# Patient Record
Sex: Male | Born: 1970
Health system: Southern US, Community
[De-identification: ages and names within clinical notes are randomized; demographics above are authoritative.]

## PROBLEM LIST (undated history)

## (undated) DIAGNOSIS — H905 Unspecified sensorineural hearing loss: Secondary | ICD-10-CM

## (undated) DIAGNOSIS — I2510 Atherosclerotic heart disease of native coronary artery without angina pectoris: Secondary | ICD-10-CM

## (undated) HISTORY — PX: SHOULDER SURGERY: SHX246

## (undated) HISTORY — DX: Atherosclerotic heart disease of native coronary artery without angina pectoris: I25.10

## (undated) HISTORY — DX: Unspecified sensorineural hearing loss: H90.5

## (undated) HISTORY — PX: ARTHROSCOPIC REPAIR ACL: SUR80

---

## 2016-01-14 ENCOUNTER — Ambulatory Visit (INDEPENDENT_AMBULATORY_CARE_PROVIDER_SITE_OTHER): Payer: BLUE CROSS/BLUE SHIELD

## 2016-01-14 ENCOUNTER — Ambulatory Visit (INDEPENDENT_AMBULATORY_CARE_PROVIDER_SITE_OTHER): Payer: BLUE CROSS/BLUE SHIELD | Admitting: Family Medicine

## 2016-01-14 VITALS — BP 110/70 | HR 68 | Temp 97.4°F | Resp 18 | Ht 68.5 in | Wt 189.0 lb

## 2016-01-14 DIAGNOSIS — M940 Chondrocostal junction syndrome [Tietze]: Secondary | ICD-10-CM | POA: Diagnosis not present

## 2016-01-14 DIAGNOSIS — Z1329 Encounter for screening for other suspected endocrine disorder: Secondary | ICD-10-CM | POA: Diagnosis not present

## 2016-01-14 DIAGNOSIS — R0789 Other chest pain: Secondary | ICD-10-CM

## 2016-01-14 DIAGNOSIS — Z13220 Encounter for screening for lipoid disorders: Secondary | ICD-10-CM | POA: Diagnosis not present

## 2016-01-14 DIAGNOSIS — Z125 Encounter for screening for malignant neoplasm of prostate: Secondary | ICD-10-CM

## 2016-01-14 DIAGNOSIS — R079 Chest pain, unspecified: Secondary | ICD-10-CM | POA: Diagnosis not present

## 2016-01-14 DIAGNOSIS — Z131 Encounter for screening for diabetes mellitus: Secondary | ICD-10-CM

## 2016-01-14 DIAGNOSIS — Z114 Encounter for screening for human immunodeficiency virus [HIV]: Secondary | ICD-10-CM

## 2016-01-14 DIAGNOSIS — Z0000 Encounter for general adult medical examination without abnormal findings: Secondary | ICD-10-CM | POA: Diagnosis not present

## 2016-01-14 LAB — LIPID PANEL
CHOLESTEROL: 174 mg/dL (ref 125–200)
HDL: 48 mg/dL (ref >=40)
LDL CALC: 114 mg/dL (ref <130)
Total CHOL/HDL Ratio: 3.6 Ratio (ref <=5.0)
Triglycerides: 62 mg/dL (ref <150)
VLDL: 12 mg/dL (ref <30)

## 2016-01-14 LAB — POCT URINALYSIS DIP (MANUAL ENTRY)
BILIRUBIN UA: NEGATIVE
Glucose, UA: NEGATIVE
Leukocytes, UA: NEGATIVE
NITRITE UA: NEGATIVE
PH UA: 5.5
Protein Ur, POC: NEGATIVE
SPEC GRAV UA: 1.025
UROBILINOGEN UA: 0.2

## 2016-01-14 LAB — CBC WITH DIFFERENTIAL/PLATELET
BASOS PCT: 0 %
Basophils Absolute: 0 cells/uL (ref 0–200)
EOS ABS: 75 {cells}/uL (ref 15–500)
EOS PCT: 1 %
HCT: 43.2 % (ref 38.5–50.0)
Hemoglobin: 14.5 g/dL (ref 13.2–17.1)
LYMPHS PCT: 21 %
Lymphs Abs: 1575 cells/uL (ref 850–3900)
MCH: 30 pg (ref 27.0–33.0)
MCHC: 33.6 g/dL (ref 32.0–36.0)
MCV: 89.3 fL (ref 80.0–100.0)
MONOS PCT: 9 %
MPV: 10.7 fL (ref 7.5–12.5)
Monocytes Absolute: 675 cells/uL (ref 200–950)
NEUTROS ABS: 5175 {cells}/uL (ref 1500–7800)
Neutrophils Relative %: 69 %
PLATELETS: 229 10*3/uL (ref 140–400)
RBC: 4.84 MIL/uL (ref 4.20–5.80)
RDW: 13.2 % (ref 11.0–15.0)
WBC: 7.5 10*3/uL (ref 3.8–10.8)

## 2016-01-14 LAB — POC MICROSCOPIC URINALYSIS (UMFC)

## 2016-01-14 LAB — COMPREHENSIVE METABOLIC PANEL
ALK PHOS: 53 U/L (ref 40–115)
ALT: 20 U/L (ref 9–46)
AST: 27 U/L (ref 10–40)
Albumin: 4.7 g/dL (ref 3.6–5.1)
BUN: 16 mg/dL (ref 7–25)
CO2: 27 mmol/L (ref 20–31)
CREATININE: 1.04 mg/dL (ref 0.60–1.35)
Calcium: 9.4 mg/dL (ref 8.6–10.3)
Chloride: 102 mmol/L (ref 98–110)
GLUCOSE: 84 mg/dL (ref 65–99)
Potassium: 4.1 mmol/L (ref 3.5–5.3)
Sodium: 140 mmol/L (ref 135–146)
TOTAL PROTEIN: 7.6 g/dL (ref 6.1–8.1)
Total Bilirubin: 0.9 mg/dL (ref 0.2–1.2)

## 2016-01-14 LAB — HIV ANTIBODY (ROUTINE TESTING W REFLEX): HIV 1&2 Ab, 4th Generation: NONREACTIVE

## 2016-01-14 LAB — PSA: PSA: 0.7 ng/mL (ref <=4.0)

## 2016-01-14 LAB — TSH: TSH: 2.68 m[IU]/L (ref 0.40–4.50)

## 2016-01-14 NOTE — Progress Notes (Signed)
Subjective:  By signing my name below, I, Corey [name], attest that this documentation has been prepared under the direction and in the presence of Corey SimmerKristi Trager, MD.  Electronically Signed: Andrew Auaven [name], ED Scribe. 01/14/2016. 10:08 PM.   Patient ID: Corey [name] Noe, male    DOB: 08/20/1970, 45 y.o.   MRN: [id number]  01/14/2016  Annual Exam (CPE (New patient))   HPI Corey [name] [name] is a 45 y.o. male who presents to the [facility] for an annual exam. This is a new patient. [place] moved [place] 8 months. Last physical was 1.5 years ago.   Hearing loss Pt reports hearing loss since birth, has hearing aids but doesn't wear them all the time.   Immunization Tetanus 5-6 years ago in [place].  Flu shot- pt never gets flu shots.   Vision  Visual Acuity Screening   Right eye Left eye Both eyes  Without correction:     With correction: 20/20 20/25 20/15    Pt is seen every year.  Dentist   He is seen every 6 months   PMHx  He reports hx of 2 herniated disc, one in cspine and lspine.  He has hx of a total of 3 arthroscopes. He also had a surgery in left shoulder to remove bone spurs.   Family hx His mother is 47 y.o. She has hx of lung cancer at age 47 and well as HTN, HLD. She was a smoker.  His father deceased at 5549. Hx of alcohol abuse. He has 2 brothers. His oldest brother has hx of arthritis.    Social hx  Pt has been married for 19 years. He has 2 step children. He lives with his 47 y.o son until he can find a place of their own. 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 He reports smoking and drinking as a teenager. He reports drug use from teenager to early 20's including marijuana, cocaine, crack, pcp and snorting heroine. Pt wears seatbelt. He does no text while driving.   Sleep Pt goes to bed at 12:30-1am and wakes up at 6-7am. This sleep pattern has been normal for him for a long time. He denies hard time falling asleep or staying asleep.    Exercise.  Pt rides bike every day for 30 mins   Chest pain Pt complains of occasional left chest pain. He suspects this is muscle pain. He has pain with deep breaths and touch. He reports similar pain about 1 year ago that last for about 7 days. He denies pain with exercise. He suspects He denies cough, SOB.   Vertigo He reports vertigo every 2 months. He has been evaluated in past. He suspects it is due to lack of sleep.   Rash Pt also had red rash on right shin. He has been told rash was eczema in the past. He applies Cerave cream.   Bowel movements Pt has a BM every day twice a day. He denies constipation, diarrhea, blood in stool, dark tarry stools.  Urine  He urinates once at night. He has a strong urinary cream.   Pt denies trouble with erection  Review of Systems  Constitutional: Negative for activity change, appetite change, chills, diaphoresis, fatigue, fever and unexpected weight change.  HENT: Positive for hearing loss. Negative for congestion, dental problem, drooling, ear discharge, ear pain, facial swelling, mouth sores, nosebleeds, postnasal drip, rhinorrhea, sinus pressure, sneezing, sore throat, tinnitus, trouble swallowing and voice change.  Eyes: Negative for photophobia, pain, discharge, redness, itching and visual disturbance.  Respiratory: Negative for apnea, cough, choking, chest tightness, shortness of breath, wheezing and stridor.   Cardiovascular: Positive for chest pain. Negative for palpitations and leg swelling.  Gastrointestinal: Negative for abdominal pain, blood in stool, constipation, diarrhea, nausea and vomiting.  Endocrine: Negative for cold intolerance, heat intolerance, polydipsia, polyphagia and polyuria.  Genitourinary: Negative for decreased urine volume, difficulty urinating, discharge, dysuria, enuresis, flank pain, frequency, genital sores, hematuria, penile pain, penile swelling, scrotal swelling, testicular pain and urgency.    Musculoskeletal: Positive for myalgias ( chest wall). Negative for arthralgias, back pain, gait problem, joint swelling, neck pain and neck stiffness.  Skin: Positive for rash. Negative for color change, pallor and wound.  Allergic/Immunologic: Negative for environmental allergies, food allergies and immunocompromised state.  Neurological: Positive for dizziness. Negative for tremors, seizures, syncope, facial asymmetry, speech difficulty, weakness, light-headedness, numbness and headaches.  Hematological: Negative for adenopathy. Does not bruise/bleed easily.  Psychiatric/Behavioral: Negative for agitation, behavioral problems, confusion, decreased concentration, dysphoric mood, hallucinations, self-injury, sleep disturbance and suicidal ideas. The patient is not nervous/anxious and is not hyperactive.     Past Medical History:  Diagnosis Date  . Congenital hearing loss    B hearing aides   Past Surgical History:  Procedure Laterality Date  . ARTHROSCOPIC REPAIR ACL     No Known Allergies No current outpatient prescriptions on file.   No current facility-administered medications for this visit.    Social History   Social History  . Marital status: Married    Spouse name: N/A  . Number of children: N/A  . Years of education: N/A   Occupational History  . Not on file.   Social History Main Topics  . Smoking status: Former Games developer  . Smokeless tobacco: Never Used  . Alcohol use No  . Drug use: Unknown  . Sexual activity: Yes    Partners: Female   Other Topics Concern  . Not on file   Social History Narrative   Marital status: married since 1998; moved from [place] in 2017      Children: 2 stepchildren (30, 23); 1 grandchild      Lives: with wife, son.      Employment:  Clinical biochemist      Tobacco:  Never      Alcohol: none      Drugs: none; marijuana/crack/cocaine/PCP/heroine; previous iv drug use as teenager; age [age].      Exercise:  Riding bike five days per  week; 30 minute bike ride at lunch each day      Seatbelt: 100%; no texting         Family History  Problem Relation Age of Onset  . Cancer Mother 2    lung cancer  . Hyperlipidemia Mother   . Hypertension Mother   . Transient ischemic attack Mother   . Alcohol abuse Father   . Arthritis Brother        Objective:    BP 110/70   Pulse 68   Temp 97.4 F (36.3 C) (Oral)   Resp 18   Ht 5' 8.5" (1.74 m)   Wt 189 lb (85.7 kg)   SpO2 97%   BMI 28.32 kg/m  Physical Exam  Constitutional: He is oriented to person, place, and time. He appears well-developed and well-nourished. No distress.  HENT:  Head: Normocephalic and atraumatic.  Right Ear: External ear normal.  Left Ear: External ear normal.  Nose: Nose normal.  Mouth/Throat: Oropharynx is clear and moist.  Eyes: Conjunctivae and EOM are normal. Pupils are equal, round, and reactive to light.  Neck: Normal range of motion. Neck supple. Carotid bruit is not present. No thyromegaly present.  Cardiovascular: Normal rate, regular rhythm, normal heart sounds and intact distal pulses.  Exam reveals no gallop and no friction rub.   No murmur heard. Pulmonary/Chest: Effort normal and breath sounds normal. He has no wheezes. He has no rales. He exhibits tenderness ( left anterior chest wall). He exhibits no laceration.  TTP left anterior chest wall  Abdominal: Soft. Bowel sounds are normal. He exhibits no distension and no mass. There is no tenderness. There is no rebound and no guarding. Hernia confirmed negative in the right inguinal area and confirmed negative in the left inguinal area.  Genitourinary: Prostate normal, testes normal and penis normal. Circumcised.  Musculoskeletal:       Right shoulder: Normal.       Left shoulder: Normal.       Cervical back: Normal.  Lymphadenopathy:    He has no cervical adenopathy.  Neurological: He is alert and oriented to person, place, and time. He has normal reflexes. No cranial nerve  deficit. He exhibits normal muscle tone. Coordination normal.  Skin: Skin is warm and dry. Rash noted. He is not diaphoretic.     Psychiatric: He has a normal mood and affect. His behavior is normal. Judgment and thought content normal.    Assessment & Plan:   1. Routine physical examination   2. Screening for prostate cancer   3. Screening for diabetes mellitus   4. Screening, lipid   5. Screening for HIV (human immunodeficiency virus)   6. Other chest pain   7. Screening for thyroid disorder   8. Costochondritis     Orders Placed This Encounter  Procedures  . DG Chest 2 View    Standing Status:   Future    Number of Occurrences:   1    Standing Expiration Date:   01/13/2017    Order Specific Question:   Reason for Exam (SYMPTOM  OR DIAGNOSIS REQUIRED)    Answer:   L sided chest pain; pain with deep inspiration    Order Specific Question:   Preferred imaging location?    Answer:   External  . CBC with Differential/Platelet  . Comprehensive metabolic panel    Order Specific Question:   Has the patient fasted?    Answer:   Yes  . Lipid panel    Order Specific Question:   Has the patient fasted?    Answer:   Yes  . HIV antibody  . PSA  . TSH  . Hemoglobin A1c  . POCT urinalysis dipstick  . POCT Microscopic Urinalysis ([facility])  . EKG 12-Lead     Return in about 1 year (around 01/13/2017) for complete physical examiniation.   I personally performed the services described in this documentation, which was scribed in my presence. The recorded information has been reviewed and considered.  Edward Trevino Paulita Fujita, M.D. [facility] [address] [place], [place]  [address] [phone] phone [phone] fax

## 2016-01-14 NOTE — Patient Instructions (Addendum)
1.  Return in 1-2 weeks to provide a repeat urine sample.   IF you received an x-ray today, you will receive an invoice from [facility]. Please contact [facility] at [phone] with questions or concerns regarding your invoice.   IF you received labwork today, you will receive an invoice from [facility]. Please contact [facility] at [phone] with questions or concerns regarding your invoice.   Our billing staff will not be able to assist you with questions regarding bills from these companies.  You will be contacted with the lab results as soon as they are available. The fastest way to get your results is to activate your My Chart account. Instructions are located on the last page of this paperwork. If you have not heard from us regarding the results in 2 weeks, please contact this office.     Keeping you healthy  Get these tests  Blood pressure- Have your blood pressure checked once a year by your healthcare provider.  Normal blood pressure is 120/80.  Weight- Have your body mass index (BMI) calculated to screen for obesity.  BMI is a measure of body fat based on height and weight. You can also calculate your own BMI at https://www.west-esparza.com/www.nhlbisupport.com/bmi/.  Cholesterol- Have your cholesterol checked regularly starting at age 47, sooner may be necessary if you have diabetes, high blood pressure, if a family member developed heart diseases at an early age or if you smoke.   Chlamydia, HIV, and other sexual transmitted disease- Get screened each year until the age of 47 then within three months of each new sexual partner.  Diabetes- Have your blood sugar checked regularly if you have high blood pressure, high cholesterol, a family history of diabetes or if you are overweight.  Get these vaccines  Flu shot- Every fall.  Tetanus shot- Every 10 years.  Menactra- Single dose; prevents meningitis.  Take these steps  Don't smoke- If you do  smoke, ask your healthcare provider about quitting. For tips on how to quit, go to www.smokefree.gov or call 1-800-QUIT-NOW.  Be physically active- Exercise 5 days a week for at least 30 minutes.  If you are not already physically active start slow and gradually work up to 30 minutes of moderate physical activity.  Examples of moderate activity include walking briskly, mowing the yard, dancing, swimming bicycling, etc.  Eat a healthy diet- Eat a variety of healthy foods such as fruits, vegetables, low fat milk, low fat cheese, yogurt, lean meats, poultry, fish, beans, tofu, etc.  For more information on healthy eating, go to www.thenutritionsource.org  Drink alcohol in moderation- Limit alcohol intake two drinks or less a day.  Never drink and drive.  Dentist- Brush and floss teeth twice daily; visit your dentis twice a year.  Depression-Your emotional health is as important as your physical health.  If you're feeling down, losing interest in things you normally enjoy please talk with your healthcare provider.  Gun Safety- If you keep a gun in your home, keep it unloaded and with the safety lock on.  Bullets should be stored separately.  Helmet use- Always wear a helmet when riding a motorcycle, bicycle, rollerblading or skateboarding.  Safe sex- If you may be exposed to a sexually transmitted infection, use a condom  Seat belts- Seat bels can save your life; always wear one.  Smoke/Carbon Monoxide detectors- These detectors need to be installed on the appropriate level of your home.  Replace batteries at least once a year.  Skin  Cancer- When out in the sun, cover up and use sunscreen SPF 15 or higher.  Violence- If anyone is threatening or hurting you, please tell your healthcare provider.

## 2016-01-15 LAB — HEMOGLOBIN A1C
HEMOGLOBIN A1C: 5.4 % (ref <5.7)
Mean Plasma Glucose: 108 mg/dL

## 2016-01-21 ENCOUNTER — Ambulatory Visit (INDEPENDENT_AMBULATORY_CARE_PROVIDER_SITE_OTHER): Payer: BLUE CROSS/BLUE SHIELD | Admitting: Family Medicine

## 2016-01-21 VITALS — BP 120/70 | HR 85 | Temp 97.7°F | Resp 18 | Ht 68.5 in | Wt 191.4 lb

## 2016-01-21 DIAGNOSIS — R319 Hematuria, unspecified: Secondary | ICD-10-CM

## 2016-01-21 LAB — POCT URINALYSIS DIP (MANUAL ENTRY)
Bilirubin, UA: NEGATIVE
GLUCOSE UA: NEGATIVE
Ketones, POC UA: NEGATIVE
Leukocytes, UA: NEGATIVE
NITRITE UA: NEGATIVE
PROTEIN UA: NEGATIVE
RBC UA: NEGATIVE
UROBILINOGEN UA: 0.2
pH, UA: 5.5

## 2016-01-21 LAB — POC MICROSCOPIC URINALYSIS (UMFC): Mucus: ABSENT

## 2016-01-21 NOTE — Progress Notes (Signed)
By signing my name below I, Shelah LewandowskyJoseph Thomas, attest that this documentation has been prepared under the direction and in the presence of Shade FloodJeffrey R Greene, MD. Electonically Signed. Shelah LewandowskyJoseph Thomas, Scribe 01/21/2016 at 3:48 PM  Subjective:    Patient ID: Corey [name], male    DOB: 07/21/1970, 45 y.o.   MRN: [id number]  Chief Complaint  Patient presents with   Follow-up    recheck urine (hematuria), patient declined flu vaccine    HPI Corey [name] is a 45 y.o. male who presents to the [facility] for reevaluation after hematuria was picked up during his annual physical on 01/14/16. Pt denies seeing any hematuria. Pt's routine labwork was otherwise normal at that time. Pt reports he had been riding his bike earlier during the day before urinalysis was done.   Pt reports that for the past 4-5 years he has been waking up once a night to urinate.   Pt saw a urologist 2 years ago for bladder discomfort and was given antibiotics which resolved pt's symptoms.   There are no active problems to display for this patient.  Past Medical History:  Diagnosis Date   Congenital hearing loss    B hearing aides   Past Surgical History:  Procedure Laterality Date   ARTHROSCOPIC REPAIR ACL     No Known Allergies Prior to Admission medications   Not on File   Social History   Social History   Marital status: Married    Spouse name: N/A   Number of children: N/A   Years of education: N/A   Occupational History   Not on file.   Social History Main Topics   Smoking status: Former Smoker   Smokeless tobacco: Never Used   Alcohol use No   Drug use: Unknown   Sexual activity: Yes    Partners: Female   Other Topics Concern   Not on file   Social History Narrative   Marital status: married since 1998; moved from [place] in 2017      Children: 2 stepchildren (30, 7633); 1 grandchild      Lives: with wife, son.      Employment:  Clinical biochemistComputer programming      Tobacco:   Never      Alcohol: none      Drugs: none; marijuana/crack/cocaine/PCP/heroine; previous iv drug use as teenager; age [age].      Exercise:  Riding bike five days per week; 30 minute bike ride at lunch each day      Seatbelt: 100%; no texting            Review of Systems  Constitutional: Negative for fever.  Genitourinary: Negative for frequency and hematuria.       Objective:   Physical Exam  Constitutional: He is oriented to person, place, and time. He appears well-developed and well-nourished. No distress.  HENT:  Head: Normocephalic and atraumatic.  Eyes: Conjunctivae are normal. Pupils are equal, round, and reactive to light.  Neck: Neck supple.  Cardiovascular: Normal rate.   Pulmonary/Chest: Effort normal.  Abdominal: Soft. Normal appearance and bowel sounds are normal. He exhibits no distension. There is no hepatosplenomegaly. There is no tenderness. There is no rigidity, no rebound, no guarding and no CVA tenderness. No hernia.  Musculoskeletal: Normal range of motion.  Neurological: He is alert and oriented to person, place, and time.  Skin: Skin is warm and dry.  Psychiatric: He has a normal mood and affect. His behavior is normal.  Nursing note and vitals reviewed.    Vitals:   01/21/16 1507  BP: 120/70  Pulse: 85  Resp: 18  Temp: 97.7 F (36.5 C)  TempSrc: Oral  SpO2: 96%  Weight: 191 lb 6 oz (86.8 kg)  Height: 5' 8.5" (1.74 m)   Results for orders placed or performed in visit on 01/21/16  POCT urinalysis dipstick  Result Value Ref Range   Color, UA yellow yellow   Clarity, UA clear clear   Glucose, UA negative negative   Bilirubin, UA negative negative   Ketones, POC UA negative negative   Spec Grav, UA <=1.005    Blood, UA negative negative   pH, UA 5.5    Protein Ur, POC negative negative   Urobilinogen, UA 0.2    Nitrite, UA Negative Negative   Leukocytes, UA Negative Negative  POCT Microscopic Urinalysis ([facility])  Result Value Ref Range    WBC,UR,HPF,POC None None WBC/hpf   RBC,UR,HPF,POC None None RBC/hpf   Bacteria None None, Too numerous to count   Mucus Absent Absent   Epithelial Cells, UR Per Microscopy None None, Too numerous to count cells/hpf         Assessment & Plan:  Corey [name] is a 45 y.o. male Hematuria - Plan: POCT urinalysis dipstick, POCT Microscopic Urinalysis ([facility])  Microscopic hematuria noted at physical, now clear. History of possible prostatitis based on his description when seen by urology in the past, most recent PSA reassuring along with lack of symptoms.   -Advised to return if any new urinary symptoms, or increased nocturia. Repeat urinalysis in 3-6 months. rtc precautions.  No orders of the defined types were placed in this encounter.  Patient Instructions     I do not see any blood in urine today. Recheck in 3-6 months for repeat urine test to make sure this remains clear. If you have any burning with urination, blood in urine, or any increase nighttime urinary symptoms, return for recheck.  IF you received an x-ray today, you will receive an invoice from [facility]. Please contact [facility] at [phone] with questions or concerns regarding your invoice.   IF you received labwork today, you will receive an invoice from [facility]. Please contact [facility] at [phone] with questions or concerns regarding your invoice.   Our billing staff will not be able to assist you with questions regarding bills from these companies.  You will be contacted with the lab results as soon as they are available. The fastest way to get your results is to activate your My Chart account. Instructions are located on the last page of this paperwork. If you have not heard from [place] regarding the results in 2 weeks, please contact this office.        I personally performed the services described in this documentation, which was scribed in my presence. The  recorded information has been reviewed and considered, and addended by me as needed.   Signed,   Meredith Staggers, MD [facility].  01/21/16 4:02 PM

## 2016-01-21 NOTE — Patient Instructions (Addendum)
   I do not see any blood in urine today. Recheck in 3-6 months for repeat urine test to make sure this remains clear. If you have any burning with urination, blood in urine, or any increase nighttime urinary symptoms, return for recheck.  IF you received an x-ray today, you will receive an invoice from [facility]. Please contact [facility] at [phone] with questions or concerns regarding your invoice.   IF you received labwork today, you will receive an invoice from [facility]. Please contact [facility] at [phone] with questions or concerns regarding your invoice.   Our billing staff will not be able to assist you with questions regarding bills from these companies.  You will be contacted with the lab results as soon as they are available. The fastest way to get your results is to activate your My Chart account. Instructions are located on the last page of this paperwork. If you have not heard from us regarding the results in 2 weeks, please contact this office.

## 2016-05-08 DIAGNOSIS — M545 Low back pain: Secondary | ICD-10-CM | POA: Diagnosis not present

## 2016-05-11 DIAGNOSIS — M545 Low back pain: Secondary | ICD-10-CM | POA: Diagnosis not present

## 2016-05-16 DIAGNOSIS — M545 Low back pain: Secondary | ICD-10-CM | POA: Diagnosis not present

## 2016-05-18 DIAGNOSIS — M545 Low back pain: Secondary | ICD-10-CM | POA: Diagnosis not present

## 2016-08-08 DIAGNOSIS — R234 Changes in skin texture: Secondary | ICD-10-CM | POA: Diagnosis not present

## 2016-08-08 DIAGNOSIS — L409 Psoriasis, unspecified: Secondary | ICD-10-CM | POA: Diagnosis not present

## 2016-10-24 DIAGNOSIS — L209 Atopic dermatitis, unspecified: Secondary | ICD-10-CM | POA: Diagnosis not present

## 2016-10-24 DIAGNOSIS — B356 Tinea cruris: Secondary | ICD-10-CM | POA: Diagnosis not present

## 2016-10-30 DIAGNOSIS — L409 Psoriasis, unspecified: Secondary | ICD-10-CM | POA: Diagnosis not present

## 2016-10-30 DIAGNOSIS — R21 Rash and other nonspecific skin eruption: Secondary | ICD-10-CM | POA: Diagnosis not present

## 2016-11-05 DIAGNOSIS — L304 Erythema intertrigo: Secondary | ICD-10-CM | POA: Diagnosis not present

## 2017-04-02 DIAGNOSIS — H6123 Impacted cerumen, bilateral: Secondary | ICD-10-CM | POA: Diagnosis not present

## 2017-06-16 DIAGNOSIS — H6120 Impacted cerumen, unspecified ear: Secondary | ICD-10-CM | POA: Diagnosis not present

## 2017-06-20 DIAGNOSIS — H6123 Impacted cerumen, bilateral: Secondary | ICD-10-CM | POA: Diagnosis not present

## 2017-06-21 ENCOUNTER — Encounter: Payer: Self-pay | Admitting: Family Medicine

## 2017-06-21 ENCOUNTER — Other Ambulatory Visit: Payer: Self-pay

## 2017-06-21 ENCOUNTER — Ambulatory Visit (INDEPENDENT_AMBULATORY_CARE_PROVIDER_SITE_OTHER): Payer: BLUE CROSS/BLUE SHIELD | Admitting: Family Medicine

## 2017-06-21 VITALS — BP 110/68 | HR 72 | Temp 98.0°F | Resp 18 | Ht 68.98 in | Wt 180.6 lb

## 2017-06-21 DIAGNOSIS — Z131 Encounter for screening for diabetes mellitus: Secondary | ICD-10-CM | POA: Diagnosis not present

## 2017-06-21 DIAGNOSIS — Z13220 Encounter for screening for lipoid disorders: Secondary | ICD-10-CM | POA: Diagnosis not present

## 2017-06-21 DIAGNOSIS — R42 Dizziness and giddiness: Secondary | ICD-10-CM

## 2017-06-21 DIAGNOSIS — R3121 Asymptomatic microscopic hematuria: Secondary | ICD-10-CM

## 2017-06-21 DIAGNOSIS — Z130 Encounter for screening for diseases of the blood and blood-forming organs and certain disorders involving the immune mechanism: Secondary | ICD-10-CM | POA: Diagnosis not present

## 2017-06-21 DIAGNOSIS — Z113 Encounter for screening for infections with a predominantly sexual mode of transmission: Secondary | ICD-10-CM | POA: Diagnosis not present

## 2017-06-21 DIAGNOSIS — Z0000 Encounter for general adult medical examination without abnormal findings: Secondary | ICD-10-CM | POA: Diagnosis not present

## 2017-06-21 DIAGNOSIS — L299 Pruritus, unspecified: Secondary | ICD-10-CM | POA: Diagnosis not present

## 2017-06-21 DIAGNOSIS — H8190 Unspecified disorder of vestibular function, unspecified ear: Secondary | ICD-10-CM

## 2017-06-21 DIAGNOSIS — G8929 Other chronic pain: Secondary | ICD-10-CM | POA: Diagnosis not present

## 2017-06-21 DIAGNOSIS — M545 Low back pain: Secondary | ICD-10-CM

## 2017-06-21 LAB — POCT URINALYSIS DIP (MANUAL ENTRY)
Bilirubin, UA: NEGATIVE
Blood, UA: NEGATIVE
GLUCOSE UA: NEGATIVE mg/dL
Ketones, POC UA: NEGATIVE mg/dL
LEUKOCYTES UA: NEGATIVE
NITRITE UA: NEGATIVE
PH UA: 6 (ref 5.0–8.0)
Protein Ur, POC: NEGATIVE mg/dL
Spec Grav, UA: >=1.03 — AB (ref 1.010–1.025)
Urobilinogen, UA: 0.2 E.U./dL

## 2017-06-21 LAB — POC MICROSCOPIC URINALYSIS (UMFC): Mucus: ABSENT

## 2017-06-21 NOTE — Patient Instructions (Addendum)
I will refer you to back specialist. See info below for now on back pain.   I will repeat urine test for blood.   Ok to use oil to itching of ear.  If that is not helping and dermatologist eval needed, please let me know.   Keeping you healthy  Get these tests  Blood pressure- Have your blood pressure checked once a year by your healthcare provider.  Normal blood pressure is 120/80.  Weight- Have your body mass index (BMI) calculated to screen for obesity.  BMI is a measure of body fat based on height and weight. You can also calculate your own BMI at [email or web address]-[email or web address].[email or web address]/.  Cholesterol- Have your cholesterol checked regularly starting at age 49, sooner may be necessary if you have diabetes, high blood pressure, if a family member developed heart diseases at an early age or if you smoke.   Chlamydia, HIV, and other sexual transmitted disease- Get screened each year until the age of 49 then within three months of each new sexual partner.  Diabetes- Have your blood sugar checked regularly if you have high blood pressure, high cholesterol, a family history of diabetes or if you are overweight.  Get these vaccines  Flu shot- Every fall.  Tetanus shot- Every 10 years.  Menactra- Single dose; prevents meningitis.   Back Pain, Adult Many adults have back pain from time to time. Common causes of back pain include:  A strained muscle or ligament.  Wear and tear (degeneration) of the spinal disks.  Arthritis.  A hit to the back.  Back pain can be short-lived (acute) or last a long time (chronic). A physical exam, lab tests, and imaging studies may be done to find the cause of your pain. Follow these instructions at home: Managing pain and stiffness  Take over-the-counter and prescription medicines only as told by your health care provider.  If directed, apply heat to the affected area as often as told by your health care provider. Use the heat source that your health care  provider recommends, such as a moist heat pack or a heating pad. ? Place a towel between your skin and the heat source. ? Leave the heat on for 20-30 minutes. ? Remove the heat if your skin turns bright red. This is especially important if you are unable to feel pain, heat, or cold. You have a greater risk of getting burned.  If directed, apply ice to the injured area: ? Put ice in a plastic bag. ? Place a towel between your skin and the bag. ? Leave the ice on for 20 minutes, 2-3 times a day for the first 2-3 days. Activity  Do not stay in bed. Resting more than 1-2 days can delay your recovery.  Take short walks on even surfaces as soon as you are able. Try to increase the length of time you walk each day.  Do not sit, drive, or stand in one place for more than 30 minutes at a time. Sitting or standing for long periods of time can put stress on your back.  Use proper lifting techniques. When you bend and lift, use positions that put less stress on your back: ? FordyceBend your knees. ? Keep the load close to your body. ? Avoid twisting.  Exercise regularly as told by your health care provider. Exercising will help your back heal faster. This also helps prevent back injuries by keeping muscles strong and flexible.  Your health care provider may recommend that you see a  physical therapist. This person can help you come up with a safe exercise program. Do any exercises as told by your physical therapist. Lifestyle  Maintain a healthy weight. Extra weight puts stress on your back and makes it difficult to have good posture.  Avoid activities or situations that make you feel anxious or stressed. Learn ways to manage anxiety and stress. One way to manage stress is through exercise. Stress and anxiety increase muscle tension and can make back pain worse. General instructions  Sleep on a firm mattress in a comfortable position. Try lying on your side with your knees slightly bent. If you lie on  your back, put a pillow under your knees.  Follow your treatment plan as told by your health care provider. This may include: ? Cognitive or behavioral therapy. ? Acupuncture or massage therapy. ? Meditation or yoga. Contact a health care provider if:  You have pain that is not relieved with rest or medicine.  You have increasing pain going down into your legs or buttocks.  Your pain does not improve in 2 weeks.  You have pain at night.  You lose weight.  You have a fever or chills. Get help right away if:  You develop new bowel or bladder control problems.  You have unusual weakness or numbness in your arms or legs.  You develop nausea or vomiting.  You develop abdominal pain.  You feel faint. Summary  Many adults have back pain from time to time. A physical exam, lab tests, and imaging studies may be done to find the cause of your pain.  Use proper lifting techniques. When you bend and lift, use positions that put less stress on your back.  Take over-the-counter and prescription medicines and apply heat or ice as directed by your health care provider. This information is not intended to replace advice given to you by your health care provider. Make sure you discuss any questions you have with your health care provider. Document Released: 05/07/2005 Document Revised: 06/11/2016 Document Reviewed: 06/11/2016 Elsevier Interactive Patient Education  2018 [employer].    Take these steps  Don't smoke- If you do smoke, ask your healthcare provider about quitting. For tips on how to quit, go to www.smokefree.gov or call 1-800-QUIT-NOW.  Be physically active- Exercise 5 days a week for at least 30 minutes.  If you are not already physically active start slow and gradually work up to 30 minutes of moderate physical activity.  Examples of moderate activity include walking briskly, mowing the yard, dancing, swimming bicycling, etc.  Eat a healthy diet- Eat a variety of  healthy foods such as fruits, vegetables, low fat milk, low fat cheese, yogurt, lean meats, poultry, fish, beans, tofu, etc.  For more information on healthy eating, go to www.thenutritionsource.org  Drink alcohol in moderation- Limit alcohol intake two drinks or less a day.  Never drink and drive.  Dentist- Brush and floss teeth twice daily; visit your dentis twice a year.  Depression-Your emotional health is as important as your physical health.  If you're feeling down, losing interest in things you normally enjoy please talk with your healthcare provider.  Gun Safety- If you keep a gun in your home, keep it unloaded and with the safety lock on.  Bullets should be stored separately.  Helmet use- Always wear a helmet when riding a motorcycle, bicycle, rollerblading or skateboarding.  Safe sex- If you may be exposed to a sexually transmitted infection, use a condom  Seat belts- Seat  bels can save your life; always wear one.  Smoke/Carbon Monoxide detectors- These detectors need to be installed on the appropriate level of your home.  Replace batteries at least once a year.  Skin Cancer- When out in the sun, cover up and use sunscreen SPF 15 or higher.  Violence- If anyone is threatening or hurting you, please tell your healthcare provider.  Vertigo Vertigo is the feeling that you or your surroundings are moving when they are not. Vertigo can be dangerous if it occurs while you are doing something that could endanger you or others, such as driving. What are the causes? This condition is caused by a disturbance in the signals that are sent by your body's sensory systems to your brain. Different causes of a disturbance can lead to vertigo, including:  Infections, especially in the inner ear.  A bad reaction to a drug, or misuse of alcohol and medicines.  Withdrawal from drugs or alcohol.  Quickly changing positions, as when lying down or rolling over in bed.  Migraine  headaches.  Decreased blood flow to the brain.  Decreased blood pressure.  Increased pressure in the brain from a head or neck injury, stroke, infection, tumor, or bleeding.  Central nervous system disorders.  What are the signs or symptoms? Symptoms of this condition usually occur when you move your head or your eyes in different directions. Symptoms may start suddenly, and they usually last for less than a minute. Symptoms may include:  Loss of balance and falling.  Feeling like you are spinning or moving.  Feeling like your surroundings are spinning or moving.  Nausea and vomiting.  Blurred vision or double vision.  Difficulty hearing.  Slurred speech.  Dizziness.  Involuntary eye movement (nystagmus).  Symptoms can be mild and cause only slight annoyance, or they can be severe and interfere with daily life. Episodes of vertigo may return (recur) over time, and they are often triggered by certain movements. Symptoms may improve over time. How is this diagnosed? This condition may be diagnosed based on medical history and the quality of your nystagmus. Your health care provider may test your eye movements by asking you to quickly change positions to trigger the nystagmus. This may be called the Dix-Hallpike test, head thrust test, or roll test. You may be referred to a health care provider who specializes in ear, nose, and throat (ENT) problems (otolaryngologist) or a provider who specializes in disorders of the central nervous system (neurologist). You may have additional testing, including:  A physical exam.  Blood tests.  MRI.  A CT scan.  An electrocardiogram (ECG). This records electrical activity in your heart.  An electroencephalogram (EEG). This records electrical activity in your brain.  Hearing tests.  How is this treated? Treatment for this condition depends on the cause and the severity of the symptoms. Treatment options include:  Medicines to treat  nausea or vertigo. These are usually used for severe cases. Some medicines that are used to treat other conditions may also reduce or eliminate vertigo symptoms. These include: ? Medicines that control allergies (antihistamines). ? Medicines that control seizures (anticonvulsants). ? Medicines that relieve depression (antidepressants). ? Medicines that relieve anxiety (sedatives).  Head movements to adjust your inner ear back to normal. If your vertigo is caused by an ear problem, your health care provider may recommend certain movements to correct the problem.  Surgery. This is rare.  Follow these instructions at home: Safety  Move slowly.Avoid sudden body or head movements.  Avoid driving.  Avoid operating heavy machinery.  Avoid doing any tasks that would cause danger to you or others if you would have a vertigo episode during the task.  If you have trouble walking or keeping your balance, try using a cane for stability. If you feel dizzy or unstable, sit down right away.  Return to your normal activities as told by your health care provider. Ask your health care provider what activities are safe for you. General instructions  Take over-the-counter and prescription medicines only as told by your health care provider.  Avoid certain positions or movements as told by your health care provider.  Drink enough fluid to keep your urine clear or pale yellow.  Keep all follow-up visits as told by your health care provider. This is important. Contact a health care provider if:  Your medicines do not relieve your vertigo or they make it worse.  You have a fever.  Your condition gets worse or you develop new symptoms.  Your family or friends notice any behavioral changes.  Your nausea or vomiting gets worse.  You have numbness or a "pins and needles" sensation in part of your body. Get help right away if:  You have difficulty moving or speaking.  You are always dizzy.  You  faint.  You develop severe headaches.  You have weakness in your hands, arms, or legs.  You have changes in your hearing or vision.  You develop a stiff neck.  You develop sensitivity to light. This information is not intended to replace advice given to you by your health care provider. Make sure you discuss any questions you have with your health care provider. Document Released: 02/14/2005 Document Revised: 10/19/2015 Document Reviewed: 08/30/2014 Elsevier Interactive Patient Education  2018 [employer].   IF you received an x-ray today, you will receive an invoice from [facility]. Please contact [facility] at [phone] with questions or concerns regarding your invoice.   IF you received labwork today, you will receive an invoice from [facility]. Please contact [facility] at [phone] with questions or concerns regarding your invoice.   Our billing staff will not be able to assist you with questions regarding bills from these companies.  You will be contacted with the lab results as soon as they are available. The fastest way to get your results is to activate your My Chart account. Instructions are located on the last page of this paperwork. If you have not heard from [place] regarding the results in 2 weeks, please contact this office.

## 2017-06-21 NOTE — Progress Notes (Signed)
Subjective:    Patient ID: Corey [name], male    DOB: 1970-10-05, 47 y.o.   MRN: [id number]  HPI Corey [name] is a 47 y.o. male Presents today for: Chief Complaint  Patient presents with  . Annual Exam   Here for complete physical/annual exam. Last seen September 2017 for hematuria. Asymptomatic at that time, microscopic hematuria initially a physical then cleared. Recommended repeat testing in 3-6 months.  Has not had retested, denies gross hematuria.   Back pain.  History of 2 herniated disk (4-5, 1-2) in lower back. MRI 10 years ago in [place]. No prior injection/surgery.  No recent eval with back specialist.  Pain with sitting especially without back support. Pain comes and goes. Treats with ice/exercise when needed. Has avoided lifting over 10 pounds, flairs with lifting grocery bags.exercises every morning from prior PT. Would like to meet with specialist to see if repeat imaging needed. No regular meds, occasional alleve for 3-7 days if flares.  No bowel or bladder incontinence, no saddle anesthesia, no lower extremity weakness.   Bread, tea about 4 hours ago.   Defers prostate cancer testing until next year. No FH of colon CA.   Immunization History  Administered Date(s) Administered  . Td 05/21/2010  refuses flu vaccine  STI screening: Active with wife only, no new contacts but would like to have testing done.   Depression screen: Depression screen [facility] 2/9 06/21/2017 01/14/2016  Decreased Interest 0 0  Down, Depressed, Hopeless 0 0  PHQ - 2 Score 0 0   Vision screen: Last saw optho about 1 and a half years ago.  No exam data present  Wears hearing aids bilaterally due to congenital hearing loss, those are doing well. Did have some irritation to right ear - saw ENT yesterday. ?psoriasis vs dry skin. Treated with hydrocortisone cream and olive oil.  Exercise Every day.   Episodic vertigo. Every few months. No recent worsening.   There are no active problems to display  for this patient.  Past Medical History:  Diagnosis Date  . Congenital hearing loss    B hearing aides   Past Surgical History:  Procedure Laterality Date  . ARTHROSCOPIC REPAIR ACL     No Known Allergies Prior to Admission medications   Not on File   Social History   Socioeconomic History  . Marital status: Married    Spouse name: Harriett Sine  . Number of children: 0  . Years of education: Not on file  . Highest education level: Not on file  Social Needs  . Financial resource strain: Not on file  . Food insecurity - worry: Not on file  . Food insecurity - inability: Not on file  . Transportation needs - medical: Not on file  . Transportation needs - non-medical: Not on file  Occupational History  . Not on file  Tobacco Use  . Smoking status: Former Games developer  . Smokeless tobacco: Never Used  Substance and Sexual Activity  . Alcohol use: No  . Drug use: No  . Sexual activity: Yes    Partners: Female  Other Topics Concern  . Not on file  Social History Narrative   Marital status: married since 1998; moved from [place] in 2017      Children: 2 stepchildren (30, 58); 1 grandchild      Lives: with wife, son.      Employment:  Clinical biochemist      Tobacco:  Never      Alcohol: none  Drugs: none; marijuana/crack/cocaine/PCP/heroine; previous iv drug use as teenager; age [age].      Exercise:  Riding bike five days per week; 30 minute bike ride at lunch each day      Seatbelt: 100%; no texting          Review of Systems 13 point review of systems per patient health survey noted.  Negative other than above.     Objective:   Physical Exam  Constitutional: He is oriented to person, place, and time. He appears well-developed and well-nourished.  HENT:  Head: Normocephalic and atraumatic.  Right Ear: External ear normal.  Left Ear: External ear normal.  Mouth/Throat: Oropharynx is clear and moist.  Eyes: Conjunctivae and EOM are normal. Pupils are equal, round, and  reactive to light.  Neck: Normal range of motion. Neck supple. No thyromegaly present.  Cardiovascular: Normal rate, regular rhythm, normal heart sounds and intact distal pulses.  Pulmonary/Chest: Effort normal and breath sounds normal. No respiratory distress. He has no wheezes.  Abdominal: Soft. He exhibits no distension. There is no tenderness.  Musculoskeletal: Normal range of motion. He exhibits no edema or tenderness.       Lumbar back: He exhibits spasm. He exhibits normal range of motion, no tenderness (min ttp paraspinals lower thoracic, upper lumbar spine. ) and no bony tenderness.  Lymphadenopathy:    He has no cervical adenopathy.  Neurological: He is alert and oriented to person, place, and time. He has normal strength and normal reflexes. No sensory deficit.  Reflex Scores:      Patellar reflexes are 2+ on the right side and 2+ on the left side.      Achilles reflexes are 2+ on the right side and 2+ on the left side. No LE weakness.   Skin: Skin is warm and dry.  Psychiatric: He has a normal mood and affect. His behavior is normal.  Vitals reviewed.  Vitals:   06/21/17 1349  BP: 110/68  Pulse: 72  Resp: 18  Temp: 98 F (36.7 C)  TempSrc: Oral  SpO2: 98%  Weight: 180 lb 9.6 oz (81.9 kg)  Height: 5' 8.98" (1.752 m)       Assessment & Plan:   Corey [name] is a 47 y.o. male Annual physical exam  - -anticipatory guidance as below in AVS, screening labs above. Health maintenance items as above in HPI discussed/recommended as applicable.   Screening, anemia, deficiency, iron - Plan: CBC  Screening for diabetes mellitus - Plan: Comprehensive metabolic panel  Screening for hyperlipidemia - Plan: Lipid panel  Asymptomatic microscopic hematuria - Plan: POCT urinalysis dipstick, POCT Microscopic Urinalysis ([facility])  - Prior reading. Repeat urinalysis, asymptomatic  Chronic bilateral low back pain without sciatica - Plan: Ambulatory referral to Orthopedic  Surgery  -Ongoing low back pain, some recent worsening possibly. Based on location appears to be paraspinals, likely will benefit from formal physical therapy. Referred to back specialist to determine next step in decision on imaging.  Ear itching  -Continue recommendations from your nose and throat. If referral needed for psoriasis, can refer to dermatology.  Episodic recurrent vertigo - Plan: CBC  -Rare symptoms, likely peripheral vertigo based on history. Asymptomatic at present.. RTC precautions if more frequent,  Routine screening for STI (sexually transmitted infection) - Plan: GC/Chlamydia Probe Amp, RPR, HIV antibody  -Low risk, requested testing.  No orders of the defined types were placed in this encounter.  Patient Instructions   I will refer you to back specialist. See info  below for now on back pain.   I will repeat urine test for blood.   Ok to use oil to itching of ear.  If that is not helping and dermatologist eval needed, please let me know.   Keeping you healthy  Get these tests  Blood pressure- Have your blood pressure checked once a year by your healthcare provider.  Normal blood pressure is 120/80.  Weight- Have your body mass index (BMI) calculated to screen for obesity.  BMI is a measure of body fat based on height and weight. You can also calculate your own BMI at https://www.west-esparza.com/.  Cholesterol- Have your cholesterol checked regularly starting at age 27, sooner may be necessary if you have diabetes, high blood pressure, if a family member developed heart diseases at an early age or if you smoke.   Chlamydia, HIV, and other sexual transmitted disease- Get screened each year until the age of 69 then within three months of each new sexual partner.  Diabetes- Have your blood sugar checked regularly if you have high blood pressure, high cholesterol, a family history of diabetes or if you are overweight.  Get these vaccines  Flu shot- Every  fall.  Tetanus shot- Every 10 years.  Menactra- Single dose; prevents meningitis.   Back Pain, Adult Many adults have back pain from time to time. Common causes of back pain include:  A strained muscle or ligament.  Wear and tear (degeneration) of the spinal disks.  Arthritis.  A hit to the back.  Back pain can be short-lived (acute) or last a long time (chronic). A physical exam, lab tests, and imaging studies may be done to find the cause of your pain. Follow these instructions at home: Managing pain and stiffness  Take over-the-counter and prescription medicines only as told by your health care provider.  If directed, apply heat to the affected area as often as told by your health care provider. Use the heat source that your health care provider recommends, such as a moist heat pack or a heating pad. ? Place a towel between your skin and the heat source. ? Leave the heat on for 20-30 minutes. ? Remove the heat if your skin turns bright red. This is especially important if you are unable to feel pain, heat, or cold. You have a greater risk of getting burned.  If directed, apply ice to the injured area: ? Put ice in a plastic bag. ? Place a towel between your skin and the bag. ? Leave the ice on for 20 minutes, 2-3 times a day for the first 2-3 days. Activity  Do not stay in bed. Resting more than 1-2 days can delay your recovery.  Take short walks on even surfaces as soon as you are able. Try to increase the length of time you walk each day.  Do not sit, drive, or stand in one place for more than 30 minutes at a time. Sitting or standing for long periods of time can put stress on your back.  Use proper lifting techniques. When you bend and lift, use positions that put less stress on your back: ? [place] your knees. ? Keep the load close to your body. ? Avoid twisting.  Exercise regularly as told by your health care provider. Exercising will help your back heal faster.  This also helps prevent back injuries by keeping muscles strong and flexible.  Your health care provider may recommend that you see a physical therapist. This person can help you come  up with a safe exercise program. Do any exercises as told by your physical therapist. Lifestyle  Maintain a healthy weight. Extra weight puts stress on your back and makes it difficult to have good posture.  Avoid activities or situations that make you feel anxious or stressed. Learn ways to manage anxiety and stress. One way to manage stress is through exercise. Stress and anxiety increase muscle tension and can make back pain worse. General instructions  Sleep on a firm mattress in a comfortable position. Try lying on your side with your knees slightly bent. If you lie on your back, put a pillow under your knees.  Follow your treatment plan as told by your health care provider. This may include: ? Cognitive or behavioral therapy. ? Acupuncture or massage therapy. ? Meditation or yoga. Contact a health care provider if:  You have pain that is not relieved with rest or medicine.  You have increasing pain going down into your legs or buttocks.  Your pain does not improve in 2 weeks.  You have pain at night.  You lose weight.  You have a fever or chills. Get help right away if:  You develop new bowel or bladder control problems.  You have unusual weakness or numbness in your arms or legs.  You develop nausea or vomiting.  You develop abdominal pain.  You feel faint. Summary  Many adults have back pain from time to time. A physical exam, lab tests, and imaging studies may be done to find the cause of your pain.  Use proper lifting techniques. When you bend and lift, use positions that put less stress on your back.  Take over-the-counter and prescription medicines and apply heat or ice as directed by your health care provider. This information is not intended to replace advice given to you by  your health care provider. Make sure you discuss any questions you have with your health care provider. Document Released: 05/07/2005 Document Revised: 06/11/2016 Document Reviewed: 06/11/2016 Elsevier Interactive Patient Education  2018 [employer]    Take these steps  Don't smoke- If you do smoke, ask your healthcare provider about quitting. For tips on how to quit, go to www.smokefree.gov or call 1-800-QUIT-NOW.  Be physically active- Exercise 5 days a week for at least 30 minutes.  If you are not already physically active start slow and gradually work up to 30 minutes of moderate physical activity.  Examples of moderate activity include walking briskly, mowing the yard, dancing, swimming bicycling, etc.  Eat a healthy diet- Eat a variety of healthy foods such as fruits, vegetables, low fat milk, low fat cheese, yogurt, lean meats, poultry, fish, beans, tofu, etc.  For more information on healthy eating, go to www.thenutritionsource.org  Drink alcohol in moderation- Limit alcohol intake two drinks or less a day.  Never drink and drive.  Dentist- Brush and floss teeth twice daily; visit your dentis twice a year.  Depression-Your emotional health is as important as your physical health.  If you're feeling down, losing interest in things you normally enjoy please talk with your healthcare provider.  Gun Safety- If you keep a gun in your home, keep it unloaded and with the safety lock on.  Bullets should be stored separately.  Helmet use- Always wear a helmet when riding a motorcycle, bicycle, rollerblading or skateboarding.  Safe sex- If you may be exposed to a sexually transmitted infection, use a condom  Seat belts- Seat bels can save your life; always wear one.  Smoke/Carbon Monoxide detectors- These detectors need to be installed on the appropriate level of your home.  Replace batteries at least once a year.  Skin Cancer- When out in the sun, cover up and use sunscreen SPF 15 or  higher.  Violence- If anyone is threatening or hurting you, please tell your healthcare provider.  Vertigo Vertigo is the feeling that you or your surroundings are moving when they are not. Vertigo can be dangerous if it occurs while you are doing something that could endanger you or others, such as driving. What are the causes? This condition is caused by a disturbance in the signals that are sent by your body's sensory systems to your brain. Different causes of a disturbance can lead to vertigo, including:  Infections, especially in the inner ear.  A bad reaction to a drug, or misuse of alcohol and medicines.  Withdrawal from drugs or alcohol.  Quickly changing positions, as when lying down or rolling over in bed.  Migraine headaches.  Decreased blood flow to the brain.  Decreased blood pressure.  Increased pressure in the brain from a head or neck injury, stroke, infection, tumor, or bleeding.  Central nervous system disorders.  What are the signs or symptoms? Symptoms of this condition usually occur when you move your head or your eyes in different directions. Symptoms may start suddenly, and they usually last for less than a minute. Symptoms may include:  Loss of balance and falling.  Feeling like you are spinning or moving.  Feeling like your surroundings are spinning or moving.  Nausea and vomiting.  Blurred vision or double vision.  Difficulty hearing.  Slurred speech.  Dizziness.  Involuntary eye movement (nystagmus).  Symptoms can be mild and cause only slight annoyance, or they can be severe and interfere with daily life. Episodes of vertigo may return (recur) over time, and they are often triggered by certain movements. Symptoms may improve over time. How is this diagnosed? This condition may be diagnosed based on medical history and the quality of your nystagmus. Your health care provider may test your eye movements by asking you to quickly change  positions to trigger the nystagmus. This may be called the Dix-Hallpike test, head thrust test, or roll test. You may be referred to a health care provider who specializes in ear, nose, and throat (ENT) problems (otolaryngologist) or a provider who specializes in disorders of the central nervous system (neurologist). You may have additional testing, including:  A physical exam.  Blood tests.  MRI.  A CT scan.  An electrocardiogram (ECG). This records electrical activity in your heart.  An electroencephalogram (EEG). This records electrical activity in your brain.  Hearing tests.  How is this treated? Treatment for this condition depends on the cause and the severity of the symptoms. Treatment options include:  Medicines to treat nausea or vertigo. These are usually used for severe cases. Some medicines that are used to treat other conditions may also reduce or eliminate vertigo symptoms. These include: ? Medicines that control allergies (antihistamines). ? Medicines that control seizures (anticonvulsants). ? Medicines that relieve depression (antidepressants). ? Medicines that relieve anxiety (sedatives).  Head movements to adjust your inner ear back to normal. If your vertigo is caused by an ear problem, your health care provider may recommend certain movements to correct the problem.  Surgery. This is rare.  Follow these instructions at home: Safety  Move slowly.Avoid sudden body or head movements.  Avoid driving.  Avoid operating heavy machinery.  Avoid  doing any tasks that would cause danger to you or others if you would have a vertigo episode during the task.  If you have trouble walking or keeping your balance, try using a cane for stability. If you feel dizzy or unstable, sit down right away.  Return to your normal activities as told by your health care provider. Ask your health care provider what activities are safe for you. General instructions  Take  over-the-counter and prescription medicines only as told by your health care provider.  Avoid certain positions or movements as told by your health care provider.  Drink enough fluid to keep your urine clear or pale yellow.  Keep all follow-up visits as told by your health care provider. This is important. Contact a health care provider if:  Your medicines do not relieve your vertigo or they make it worse.  You have a fever.  Your condition gets worse or you develop new symptoms.  Your family or friends notice any behavioral changes.  Your nausea or vomiting gets worse.  You have numbness or a "pins and needles" sensation in part of your body. Get help right away if:  You have difficulty moving or speaking.  You are always dizzy.  You faint.  You develop severe headaches.  You have weakness in your hands, arms, or legs.  You have changes in your hearing or vision.  You develop a stiff neck.  You develop sensitivity to light. This information is not intended to replace advice given to you by your health care provider. Make sure you discuss any questions you have with your health care provider. Document Released: 02/14/2005 Document Revised: 10/19/2015 Document Reviewed: 08/30/2014 Elsevier Interactive Patient Education  2018 [employer].   IF you received an x-ray today, you will receive an invoice from [facility]. Please contact [facility] at [phone] with questions or concerns regarding your invoice.   IF you received labwork today, you will receive an invoice from [place]. Please contact [facility] at [phone] with questions or concerns regarding your invoice.   Our billing staff will not be able to assist you with questions regarding bills from these companies.  You will be contacted with the lab results as soon as they are available. The fastest way to get your results is to activate your My Chart account. Instructions are located on  the last page of this paperwork. If you have not heard from [place] regarding the results in 2 weeks, please contact this office.      Signed,   Meredith Staggers, MD [facility].  06/22/17 4:22 PM

## 2017-06-22 LAB — COMPREHENSIVE METABOLIC PANEL
ALBUMIN: 4.6 g/dL (ref 3.5–5.5)
ALT: 16 IU/L (ref 0–44)
AST: 25 IU/L (ref 0–40)
Albumin/Globulin Ratio: 1.8 (ref 1.2–2.2)
Alkaline Phosphatase: 50 IU/L (ref 39–117)
BUN / CREAT RATIO: 19 (ref 9–20)
BUN: 20 mg/dL (ref 6–24)
Bilirubin Total: 0.6 mg/dL (ref 0.0–1.2)
CALCIUM: 9.4 mg/dL (ref 8.7–10.2)
CO2: 27 mmol/L (ref 20–29)
Chloride: 100 mmol/L (ref 96–106)
Creatinine, Ser: 1.04 mg/dL (ref 0.76–1.27)
GFR calc non Af Amer: 85 mL/min/{1.73_m2} (ref >59)
GFR, EST AFRICAN AMERICAN: 98 mL/min/{1.73_m2} (ref >59)
GLUCOSE: 87 mg/dL (ref 65–99)
Globulin, Total: 2.5 g/dL (ref 1.5–4.5)
Potassium: 5 mmol/L (ref 3.5–5.2)
Sodium: 141 mmol/L (ref 134–144)
TOTAL PROTEIN: 7.1 g/dL (ref 6.0–8.5)

## 2017-06-22 LAB — CBC
Hematocrit: 43 % (ref 37.5–51.0)
Hemoglobin: 14.3 g/dL (ref 13.0–17.7)
MCH: 30 pg (ref 26.6–33.0)
MCHC: 33.3 g/dL (ref 31.5–35.7)
MCV: 90 fL (ref 79–97)
PLATELETS: 196 10*3/uL (ref 150–379)
RBC: 4.76 x10E6/uL (ref 4.14–5.80)
RDW: 13.3 % (ref 12.3–15.4)
WBC: 4.5 10*3/uL (ref 3.4–10.8)

## 2017-06-22 LAB — RPR: RPR Ser Ql: NONREACTIVE

## 2017-06-22 LAB — LIPID PANEL
Chol/HDL Ratio: 3.7 ratio (ref 0.0–5.0)
Cholesterol, Total: 157 mg/dL (ref 100–199)
HDL: 42 mg/dL (ref >39)
LDL Calculated: 101 mg/dL — ABNORMAL HIGH (ref 0–99)
Triglycerides: 70 mg/dL (ref 0–149)
VLDL CHOLESTEROL CAL: 14 mg/dL (ref 5–40)

## 2017-06-22 LAB — HIV ANTIBODY (ROUTINE TESTING W REFLEX): HIV Screen 4th Generation wRfx: NONREACTIVE

## 2017-06-23 LAB — GC/CHLAMYDIA PROBE AMP
Chlamydia trachomatis, NAA: NEGATIVE
Neisseria gonorrhoeae by PCR: NEGATIVE

## 2017-07-19 DIAGNOSIS — M545 Low back pain: Secondary | ICD-10-CM | POA: Diagnosis not present

## 2017-07-19 DIAGNOSIS — M546 Pain in thoracic spine: Secondary | ICD-10-CM | POA: Diagnosis not present

## 2017-08-13 DIAGNOSIS — M5033 Other cervical disc degeneration, cervicothoracic region: Secondary | ICD-10-CM | POA: Diagnosis not present

## 2017-08-13 DIAGNOSIS — M542 Cervicalgia: Secondary | ICD-10-CM | POA: Diagnosis not present

## 2017-08-14 DIAGNOSIS — H6981 Other specified disorders of Eustachian tube, right ear: Secondary | ICD-10-CM | POA: Diagnosis not present

## 2017-08-20 DIAGNOSIS — M542 Cervicalgia: Secondary | ICD-10-CM | POA: Diagnosis not present

## 2017-08-20 DIAGNOSIS — M5033 Other cervical disc degeneration, cervicothoracic region: Secondary | ICD-10-CM | POA: Diagnosis not present

## 2017-08-22 DIAGNOSIS — M5033 Other cervical disc degeneration, cervicothoracic region: Secondary | ICD-10-CM | POA: Diagnosis not present

## 2017-08-22 DIAGNOSIS — M542 Cervicalgia: Secondary | ICD-10-CM | POA: Diagnosis not present

## 2017-08-27 DIAGNOSIS — M5033 Other cervical disc degeneration, cervicothoracic region: Secondary | ICD-10-CM | POA: Diagnosis not present

## 2017-08-27 DIAGNOSIS — M542 Cervicalgia: Secondary | ICD-10-CM | POA: Diagnosis not present

## 2017-08-29 DIAGNOSIS — M5033 Other cervical disc degeneration, cervicothoracic region: Secondary | ICD-10-CM | POA: Diagnosis not present

## 2017-08-29 DIAGNOSIS — M542 Cervicalgia: Secondary | ICD-10-CM | POA: Diagnosis not present

## 2017-09-03 DIAGNOSIS — M5033 Other cervical disc degeneration, cervicothoracic region: Secondary | ICD-10-CM | POA: Diagnosis not present

## 2017-09-03 DIAGNOSIS — M542 Cervicalgia: Secondary | ICD-10-CM | POA: Diagnosis not present

## 2017-09-10 DIAGNOSIS — M5033 Other cervical disc degeneration, cervicothoracic region: Secondary | ICD-10-CM | POA: Diagnosis not present

## 2017-09-10 DIAGNOSIS — M542 Cervicalgia: Secondary | ICD-10-CM | POA: Diagnosis not present

## 2017-09-16 DIAGNOSIS — M542 Cervicalgia: Secondary | ICD-10-CM | POA: Diagnosis not present

## 2017-09-16 DIAGNOSIS — M5033 Other cervical disc degeneration, cervicothoracic region: Secondary | ICD-10-CM | POA: Diagnosis not present

## 2017-10-10 ENCOUNTER — Encounter: Payer: Self-pay | Admitting: Family Medicine

## 2018-10-10 DIAGNOSIS — L239 Allergic contact dermatitis, unspecified cause: Secondary | ICD-10-CM | POA: Diagnosis not present

## 2018-10-10 DIAGNOSIS — B009 Herpesviral infection, unspecified: Secondary | ICD-10-CM | POA: Diagnosis not present

## 2018-11-18 DIAGNOSIS — M7052 Other bursitis of knee, left knee: Secondary | ICD-10-CM | POA: Diagnosis not present

## 2018-11-18 DIAGNOSIS — M25562 Pain in left knee: Secondary | ICD-10-CM | POA: Diagnosis not present

## 2018-12-03 DIAGNOSIS — Z20828 Contact with and (suspected) exposure to other viral communicable diseases: Secondary | ICD-10-CM | POA: Diagnosis not present

## 2019-02-17 DIAGNOSIS — L409 Psoriasis, unspecified: Secondary | ICD-10-CM | POA: Diagnosis not present

## 2019-02-17 DIAGNOSIS — L906 Striae atrophicae: Secondary | ICD-10-CM | POA: Diagnosis not present

## 2019-02-17 DIAGNOSIS — Z23 Encounter for immunization: Secondary | ICD-10-CM | POA: Diagnosis not present

## 2019-03-12 ENCOUNTER — Other Ambulatory Visit: Payer: Self-pay

## 2019-03-12 ENCOUNTER — Ambulatory Visit (INDEPENDENT_AMBULATORY_CARE_PROVIDER_SITE_OTHER): Payer: BC Managed Care – PPO | Admitting: Family Medicine

## 2019-03-12 ENCOUNTER — Encounter: Payer: Self-pay | Admitting: Family Medicine

## 2019-03-12 VITALS — BP 130/88 | HR 90 | Wt 190.6 lb

## 2019-03-12 DIAGNOSIS — Z130 Encounter for screening for diseases of the blood and blood-forming organs and certain disorders involving the immune mechanism: Secondary | ICD-10-CM

## 2019-03-12 DIAGNOSIS — Z0001 Encounter for general adult medical examination with abnormal findings: Secondary | ICD-10-CM | POA: Diagnosis not present

## 2019-03-12 DIAGNOSIS — G44319 Acute post-traumatic headache, not intractable: Secondary | ICD-10-CM | POA: Diagnosis not present

## 2019-03-12 DIAGNOSIS — Z1329 Encounter for screening for other suspected endocrine disorder: Secondary | ICD-10-CM

## 2019-03-12 DIAGNOSIS — Z0000 Encounter for general adult medical examination without abnormal findings: Secondary | ICD-10-CM

## 2019-03-12 DIAGNOSIS — E559 Vitamin D deficiency, unspecified: Secondary | ICD-10-CM | POA: Diagnosis not present

## 2019-03-12 DIAGNOSIS — Z131 Encounter for screening for diabetes mellitus: Secondary | ICD-10-CM

## 2019-03-12 DIAGNOSIS — Z1211 Encounter for screening for malignant neoplasm of colon: Secondary | ICD-10-CM

## 2019-03-12 DIAGNOSIS — Z13220 Encounter for screening for lipoid disorders: Secondary | ICD-10-CM

## 2019-03-12 DIAGNOSIS — L408 Other psoriasis: Secondary | ICD-10-CM

## 2019-03-12 DIAGNOSIS — N5089 Other specified disorders of the male genital organs: Secondary | ICD-10-CM

## 2019-03-12 DIAGNOSIS — Z125 Encounter for screening for malignant neoplasm of prostate: Secondary | ICD-10-CM | POA: Diagnosis not present

## 2019-03-12 NOTE — Progress Notes (Signed)
Subjective:    Patient ID: Corey [name], male    DOB: [birth year], 48 y.o.   MRN: [id number]  HPI Corey [name] is a 48 y.o. male Presents today for: Chief Complaint  Patient presents with  . Annual Exam    annual exam   Presents for annual physical exam.  Last physical in February 2019.  History of congenital hearing loss, wears hearing aids, followed by ENT. Prior history of episodic vertigo - doing ok. No recent flair. Last 5-6 months ago.   Back pain  discussed last year, referred to Dr. Yevette Edwards.  Evaluated for cervical, thoracic, lumbar pain, thought to have myofascial component along with prior multiple disc herniations plan for MRI then likely physical therapy. MRI was denied. Did go to PT, helped.  Doing ok now.   Head contusion, headache: Hit R side of head painting on side of roof. No LOC, sore for a minute or two, no bleeding, no wound, no swelling.  No n/v/weakness. No visions changes.  Initial week felt ok, then increased pain a week later - felt like baseball hit on side of head. Trouble thinking, but no n/v/vision changes. Constant pain for 3 weeks, now improved. Last HA about 1-2 weeks ago. Tx: deep blue topical - improved pain. (placed on head and neck).   Hernia? In left scrotum for years, has not had treated recently. Prior L hernia repair at 48yo on left.  Sore at times - stinging pain a few times per year. No penile d/c, no urinary sx's. Feel like left testicle larger than right past 5 years. No prior ultrasound.   Inverse psoriasis: Treated by Urgent Care in past.  Scrotum, penis, perianal area.  Treats with Ceravie, custom essential oil compound.  Flares 3x/year.  Used hydrocortisone in past, caused stretch marks.  Treated with Protopic by dermatology few years ago - Elmon Else.  Vitamin D deficiency:  Taking 3000-4000 units per day.  Low reading in [place]. Unknown reading.   Cancer screening: Colonoscopy - agrees to referral.  Prostate  - agrees to  testing after R/B of testing.  Derm - as above  Immunization History  Administered Date(s) Administered  . Td 05/21/2010  Flu vaccine: declines flu vaccine, including after discussion and risk of "twindemic".  Shingles vaccine: declines until age 58.   Depression screen [facility] 2/9 03/12/2019 06/21/2017 01/14/2016  Decreased Interest 0 0 0  Down, Depressed, Hopeless 0 0 0  PHQ - 2 Score 0 0 0    Hearing Screening             Right ear:           Left ear:             Visual Acuity Screening   Right eye Left eye Both eyes  Without correction:     With correction:  last optho visit 3 yrs ago - plans on scheduling appt.   Dental: last appt 8months ago appt in December.   Exercise: per day. Playing tennis on weekends, and mowing yard.   There are no active problems to display for this patient.  Past Medical History:  Diagnosis Date  . Congenital hearing loss    B hearing aides   Past Surgical History:  Procedure Laterality Date  . ARTHROSCOPIC REPAIR ACL     No Known Allergies Prior to Admission medications   Not on File   Social History   Socioeconomic History  . Marital status: Married  Spouse name: Harriett Sineancy  . Number of children: 0  . Years of education: Not on file  . Highest education level: Not on file  Occupational History  . Not on file  Social Needs  . Financial resource strain: Not on file  . Food insecurity    Worry: Not on file    Inability: Not on file  . Transportation needs    Medical: Not on file    Non-medical: Not on file  Tobacco Use  . Smoking status: Former Games developermoker  . Smokeless tobacco: Never Used  Substance and Sexual Activity  . Alcohol use: No  . Drug use: No  . Sexual activity: Yes    Partners: Female  Lifestyle  . Physical activity    Days per week: Not on file    Minutes per session: Not on file  . Stress: Not on file  Relationships  . Social  Musicianconnections    Talks on phone: Not on file    Gets together: Not on file    Attends religious service: Not on file    Active member of club or organization: Not on file    Attends meetings of clubs or organizations: Not on file    Relationship status: Not on file  . Intimate partner violence    Fear of current or ex partner: Not on file    Emotionally abused: Not on file    Physically abused: Not on file    Forced sexual activity: Not on file  Other Topics Concern  . Not on file  Social History Narrative   Marital status: married since 1998; moved from [place] in 2017      Children: 2 stepchildren (30, [phone]); 1 grandchild      Lives: with wife, son.      Employment:  Clinical biochemistComputer programming      Tobacco:  Never      Alcohol: none      Drugs: none; marijuana/crack/cocaine/PCP/heroine; previous iv drug use as teenager; age [age].      Exercise:  Riding bike five days per week; 30 minute bike ride at lunch each day      Seatbelt: 100%; no texting          Review of Systems  Headache Rash.  13 point review of systems per patient health survey noted.  Negative other than as indicated above or in HPI.      Objective:   Physical Exam Vitals signs reviewed.  Constitutional:      Appearance: He is well-developed.  HENT:     Head: Normocephalic and atraumatic.     Right Ear: External ear normal.     Left Ear: External ear normal.  Eyes:     Conjunctiva/sclera: Conjunctivae normal.     Pupils: Pupils are equal, round, and reactive to light.  Neck:     Musculoskeletal: Normal range of motion and neck supple.     Thyroid: No thyromegaly.  Cardiovascular:     Rate and Rhythm: Normal rate and regular rhythm.     Heart sounds: Normal heart sounds.  Pulmonary:     Effort: Pulmonary effort is normal. No respiratory distress.     Breath sounds: Normal breath sounds. No wheezing.  Abdominal:     General: There is no distension.     Palpations: Abdomen is soft.     Tenderness: There is no  abdominal tenderness.     Hernia: There is no hernia in the left inguinal area or right inguinal area.  Genitourinary:    Prostate: Normal.    Musculoskeletal: Normal range of motion.        General: No tenderness.  Lymphadenopathy:     Cervical: No cervical adenopathy.  Skin:    General: Skin is warm and dry.  Neurological:     Mental Status: He is alert and oriented to person, place, and time.     Deep Tendon Reflexes: Reflexes are normal and symmetric.  Psychiatric:        Behavior: Behavior normal.    Vitals:   03/12/19 1036  BP: 130/88  Pulse: 90  SpO2: 97%  Weight: 190 lb 9.6 oz (86.5 kg)  Patient declined temperature measurement.  Did not want to remove mask. Denies fever, no current illness symptoms.     Assessment & Plan:   Edmund Holcomb is a 48 y.o. male Annual physical exam  - -anticipatory guidance as below in AVS, screening labs above. Health maintenance items as above in HPI discussed/recommended as applicable.   Acute post-traumatic headache, not intractable  - resolved. rtc precautions discussed.   Vitamin D deficiency - Plan: Vitamin D, 25-hydroxy  - continue supplement, check labs.   Screening for prostate cancer - Plan: PSA  - We discussed pros and cons of prostate cancer screening, and after this discussion, he chose to have screening done. PSA obtained, and no concerning findings on DRE.   Special screening for malignant neoplasms, colon - Plan: Ambulatory referral to Gastroenterology  Screening for diabetes mellitus - Plan: Comprehensive metabolic panel  Screening for thyroid disorder - Plan: TSH  Screening, anemia, deficiency, iron - Plan: CBC  Screening for hyperlipidemia - Plan: Lipid panel  Scrotal sac enlargement - Plan: US Scrotum  - varicocele vs. Less likely hernia. Scrotal US ordered.   Inverse psoriasis - Plan: Ambulatory referral to Dermatology   No orders of the defined types were placed in this encounter.  Patient  Instructions   If headache returns, please return for recheck to determine if other testing needed.  Return to the clinic or go to the nearest emergency room if any of your symptoms worsen or new symptoms occur.  If flare of back issues, follow up with Dr. Laurena Bering office.   I will refer you to dermatology to discuss treatment of inverse psoriasis, and gastroenterology for colonoscopy  For scrotal issue, I will order an ultrasound.   Depending on above results may need to follow-up in office but will let you know.  Thank you for coming in today.  Return to the clinic or go to the nearest emergency room if any of your symptoms worsen or new symptoms occur.   Keeping you healthy  Get these tests  Blood pressure- Have your blood pressure checked once a year by your healthcare provider.  Normal blood pressure is 120/80  Weight- Have your body mass index (BMI) calculated to screen for obesity.  BMI is a measure of body fat based on height and weight. You can also calculate your own BMI at ViewBanking.si.  Cholesterol- Have your cholesterol checked every year.  Diabetes- Have your blood sugar checked regularly if you have high blood pressure, high cholesterol, have a family history of diabetes or if you are overweight.  Screening for Colon Cancer- Colonoscopy starting at age 67.  Screening may begin sooner depending on your family history and other health conditions. Follow up colonoscopy as directed by your Gastroenterologist.  Screening for Prostate Cancer- Both blood work (PSA) and a rectal exam help screen for  Prostate Cancer.  Screening begins at age 65 with African-American men and at age 58 with Caucasian men.  Screening may begin sooner depending on your family history.  Take these medicines  Aspirin- One aspirin daily can help prevent Heart disease and Stroke.  Flu shot- Every fall.  Tetanus- Every 10 years.  Zostavax- Once after the age of 44 to prevent  Shingles.  Pneumonia shot- Once after the age of 69; if you are younger than 18, ask your healthcare provider if you need a Pneumonia shot.  Take these steps  Don't smoke- If you do smoke, talk to your doctor about quitting.  For tips on how to quit, go to www.smokefree.gov or call 1-800-QUIT-NOW.  Be physically active- Exercise 5 days a week for at least 30 minutes.  If you are not already physically active start slow and gradually work up to 30 minutes of moderate physical activity.  Examples of moderate activity include walking briskly, mowing the yard, dancing, swimming, bicycling, etc.  Eat a healthy diet- Eat a variety of healthy food such as fruits, vegetables, low fat milk, low fat cheese, yogurt, lean meant, poultry, fish, beans, tofu, etc. For more information go to www.thenutritionsource.org  Drink alcohol in moderation- Limit alcohol intake to less than two drinks a day. Never drink and drive.  Dentist- Brush and floss twice daily; visit your dentist twice a year.  Depression- Your emotional health is as important as your physical health. If you're feeling down, or losing interest in things you would normally enjoy please talk to your healthcare provider.  Eye exam- Visit your eye doctor every year.  Safe sex- If you may be exposed to a sexually transmitted infection, use a condom.  Seat belts- Seat belts can save your life; always wear one.  Smoke/Carbon Monoxide detectors- These detectors need to be installed on the appropriate level of your home.  Replace batteries at least once a year.  Skin cancer- When out in the sun, cover up and use sunscreen 15 SPF or higher.  Violence- If anyone is threatening you, please tell your healthcare provider.  Living Will/ Health care power of attorney- Speak with your healthcare provider and family. General Headache Without Cause A headache is pain or discomfort that is felt around the head or neck area. There are many causes and types  of headaches. In some cases, the cause may not be found. Follow these instructions at home: Watch your condition for any changes. Let your doctor know about them. Take these steps to help with your condition: Managing pain      Take over-the-counter and prescription medicines only as told by your doctor.  Lie down in a dark, quiet room when you have a headache.  If told, put ice on your head and neck area: ? Put ice in a plastic bag. ? Place a towel between your skin and the bag. ? Leave the ice on for 20 minutes, 2-3 times per day.  If told, put heat on the affected area. Use the heat source that your doctor recommends, such as a moist heat pack or a heating pad. ? Place a towel between your skin and the heat source. ? Leave the heat on for 20-30 minutes. ? Remove the heat if your skin turns bright red. This is very important if you are unable to feel pain, heat, or cold. You may have a greater risk of getting burned.  Keep lights dim if bright lights bother you or make your headaches  worse. Eating and drinking  Eat meals on a regular schedule.  If you drink alcohol: ? Limit how much you use to:  0-1 drink a day for women.  0-2 drinks a day for men. ? Be aware of how much alcohol is in your drink. In the [place] one drink equals one 12 oz bottle of beer (355 mL), one 5 oz glass of wine (148 mL), or one 1 oz glass of hard liquor (44 mL).  Stop drinking caffeine, or reduce how much caffeine you drink. General instructions   Keep a journal to find out if certain things bring on headaches. For example, write down: ? What you eat and drink. ? How much sleep you get. ? Any change to your diet or medicines.  Get a massage or try other ways to relax.  Limit stress.  Sit up straight. Do not tighten (tense) your muscles.  Do not use any products that contain nicotine or tobacco. This includes cigarettes, e-cigarettes, and chewing tobacco. If you need help quitting, ask your  doctor.  Exercise regularly as told by your doctor.  Get enough sleep. This often means 7-9 hours of sleep each night.  Keep all follow-up visits as told by your doctor. This is important. Contact a doctor if:  Your symptoms are not helped by medicine.  You have a headache that feels different than the other headaches.  You feel sick to your stomach (nauseous) or you throw up (vomit).  You have a fever. Get help right away if:  Your headache gets very bad quickly.  Your headache gets worse after a lot of physical activity.  You keep throwing up.  You have a stiff neck.  You have trouble seeing.  You have trouble speaking.  You have pain in the eye or ear.  Your muscles are weak or you lose muscle control.  You lose your balance or have trouble walking.  You feel like you will pass out (faint) or you pass out.  You are mixed up (confused).  You have a seizure. Summary  A headache is pain or discomfort that is felt around the head or neck area.  There are many causes and types of headaches. In some cases, the cause may not be found.  Keep a journal to help find out what causes your headaches. Watch your condition for any changes. Let your doctor know about them.  Contact a doctor if you have a headache that is different from usual, or if your headache is not helped by medicine.  Get help right away if your headache gets very bad, you throw up, you have trouble seeing, you lose your balance, or you have a seizure. This information is not intended to replace advice given to you by your health care provider. Make sure you discuss any questions you have with your health care provider. Document Released: 02/14/2008 Document Revised: 11/25/2017 Document Reviewed: 11/25/2017 Elsevier Patient Education  [employer].   If you have lab work done today you will be contacted with your lab results within the next 2 weeks.  If you have not heard from [place] then please  contact us. The fastest way to get your results is to register for My Chart.   IF you received an x-ray today, you will receive an invoice from [facility]. Please contact [facility] at [phone] with questions or concerns regarding your invoice.   IF you received labwork today, you will receive an invoice from [place]. Please contact  [facility] at [phone] with questions or concerns regarding your invoice.   Our billing staff will not be able to assist you with questions regarding bills from these companies.  You will be contacted with the lab results as soon as they are available. The fastest way to get your results is to activate your My Chart account. Instructions are located on the last page of this paperwork. If you have not heard from [place] regarding the results in 2 weeks, please contact this office.       Signed,   Meredith Staggers, MD [facility].  03/12/19 10:42 PM

## 2019-03-12 NOTE — Patient Instructions (Addendum)
If headache returns, please return for recheck to determine if other testing needed.  Return to the clinic or go to the nearest emergency room if any of your symptoms worsen or new symptoms occur.  If flare of back issues, follow up with Dr. Laurena Bering office.   I will refer you to dermatology to discuss treatment of inverse psoriasis, and gastroenterology for colonoscopy  For scrotal issue, I will order an ultrasound.   Depending on above results may need to follow-up in office but will let you know.  Thank you for coming in today.  Return to the clinic or go to the nearest emergency room if any of your symptoms worsen or new symptoms occur.   Keeping you healthy  Get these tests  Blood pressure- Have your blood pressure checked once a year by your healthcare provider.  Normal blood pressure is 120/80  Weight- Have your body mass index (BMI) calculated to screen for obesity.  BMI is a measure of body fat based on height and weight. You can also calculate your own BMI at [employer].  Cholesterol- Have your cholesterol checked every year.  Diabetes- Have your blood sugar checked regularly if you have high blood pressure, high cholesterol, have a family history of diabetes or if you are overweight.  Screening for Colon Cancer- Colonoscopy starting at age 73.  Screening may begin sooner depending on your family history and other health conditions. Follow up colonoscopy as directed by your Gastroenterologist.  Screening for Prostate Cancer- Both blood work (PSA) and a rectal exam help screen for Prostate Cancer.  Screening begins at age 40 with African-American men and at age 41 with Caucasian men.  Screening may begin sooner depending on your family history.  Take these medicines  Aspirin- One aspirin daily can help prevent Heart disease and Stroke.  Flu shot- Every fall.  Tetanus- Every 10 years.  Zostavax- Once after the age of 77 to prevent Shingles.  Pneumonia  shot- Once after the age of 64; if you are younger than 12, ask your healthcare provider if you need a Pneumonia shot.  Take these steps  Don't smoke- If you do smoke, talk to your doctor about quitting.  For tips on how to quit, go to www.smokefree.gov or call 1-800-QUIT-NOW.  Be physically active- Exercise 5 days a week for at least 30 minutes.  If you are not already physically active start slow and gradually work up to 30 minutes of moderate physical activity.  Examples of moderate activity include walking briskly, mowing the yard, dancing, swimming, bicycling, etc.  Eat a healthy diet- Eat a variety of healthy food such as fruits, vegetables, low fat milk, low fat cheese, yogurt, lean meant, poultry, fish, beans, tofu, etc. For more information go to www.thenutritionsource.org  Drink alcohol in moderation- Limit alcohol intake to less than two drinks a day. Never drink and drive.  Dentist- Brush and floss twice daily; visit your dentist twice a year.  Depression- Your emotional health is as important as your physical health. If you're feeling down, or losing interest in things you would normally enjoy please talk to your healthcare provider.  Eye exam- Visit your eye doctor every year.  Safe sex- If you may be exposed to a sexually transmitted infection, use a condom.  Seat belts- Seat belts can save your life; always wear one.  Smoke/Carbon Monoxide detectors- These detectors need to be installed on the appropriate level of your home.  Replace batteries at least once a year.  Skin cancer- When out in the sun, cover up and use sunscreen 15 SPF or higher.  Violence- If anyone is threatening you, please tell your healthcare provider.  Living Will/ Health care power of attorney- Speak with your healthcare provider and family. General Headache Without Cause A headache is pain or discomfort that is felt around the head or neck area. There are many causes and types of headaches. In some  cases, the cause may not be found. Follow these instructions at home: Watch your condition for any changes. Let your doctor know about them. Take these steps to help with your condition: Managing pain      Take over-the-counter and prescription medicines only as told by your doctor.  Lie down in a dark, quiet room when you have a headache.  If told, put ice on your head and neck area: ? Put ice in a plastic bag. ? Place a towel between your skin and the bag. ? Leave the ice on for 20 minutes, 2-3 times per day.  If told, put heat on the affected area. Use the heat source that your doctor recommends, such as a moist heat pack or a heating pad. ? Place a towel between your skin and the heat source. ? Leave the heat on for 20-30 minutes. ? Remove the heat if your skin turns bright red. This is very important if you are unable to feel pain, heat, or cold. You may have a greater risk of getting burned.  Keep lights dim if bright lights bother you or make your headaches worse. Eating and drinking  Eat meals on a regular schedule.  If you drink alcohol: ? Limit how much you use to:  0-1 drink a day for women.  0-2 drinks a day for men. ? Be aware of how much alcohol is in your drink. In the [place] one drink equals one 12 oz bottle of beer (355 mL), one 5 oz glass of wine (148 mL), or one 1 oz glass of hard liquor (44 mL).  Stop drinking caffeine, or reduce how much caffeine you drink. General instructions   Keep a journal to find out if certain things bring on headaches. For example, write down: ? What you eat and drink. ? How much sleep you get. ? Any change to your diet or medicines.  Get a massage or try other ways to relax.  Limit stress.  Sit up straight. Do not tighten (tense) your muscles.  Do not use any products that contain nicotine or tobacco. This includes cigarettes, e-cigarettes, and chewing tobacco. If you need help quitting, ask your doctor.  Exercise  regularly as told by your doctor.  Get enough sleep. This often means 7-9 hours of sleep each night.  Keep all follow-up visits as told by your doctor. This is important. Contact a doctor if:  Your symptoms are not helped by medicine.  You have a headache that feels different than the other headaches.  You feel sick to your stomach (nauseous) or you throw up (vomit).  You have a fever. Get help right away if:  Your headache gets very bad quickly.  Your headache gets worse after a lot of physical activity.  You keep throwing up.  You have a stiff neck.  You have trouble seeing.  You have trouble speaking.  You have pain in the eye or ear.  Your muscles are weak or you lose muscle control.  You lose your balance or have trouble walking.  You feel  like you will pass out (faint) or you pass out.  You are mixed up (confused).  You have a seizure. Summary  A headache is pain or discomfort that is felt around the head or neck area.  There are many causes and types of headaches. In some cases, the cause may not be found.  Keep a journal to help find out what causes your headaches. Watch your condition for any changes. Let your doctor know about them.  Contact a doctor if you have a headache that is different from usual, or if your headache is not helped by medicine.  Get help right away if your headache gets very bad, you throw up, you have trouble seeing, you lose your balance, or you have a seizure. This information is not intended to replace advice given to you by your health care provider. Make sure you discuss any questions you have with your health care provider. Document Released: 02/14/2008 Document Revised: 11/25/2017 Document Reviewed: 11/25/2017 Elsevier Patient Education  [employer].   If you have lab work done today you will be contacted with your lab results within the next 2 weeks.  If you have not heard from [place] then please contact us. The fastest  way to get your results is to register for My Chart.   IF you received an x-ray today, you will receive an invoice from [facility]. Please contact [facility] at [phone] with questions or concerns regarding your invoice.   IF you received labwork today, you will receive an invoice from [facility]. Please contact [facility] at [phone] with questions or concerns regarding your invoice.   Our billing staff will not be able to assist you with questions regarding bills from these companies.  You will be contacted with the lab results as soon as they are available. The fastest way to get your results is to activate your My Chart account. Instructions are located on the last page of this paperwork. If you have not heard from [place] regarding the results in 2 weeks, please contact this office.

## 2019-03-13 ENCOUNTER — Encounter: Payer: Self-pay | Admitting: Internal Medicine

## 2019-03-13 LAB — TSH: TSH: 2.08 u[IU]/mL (ref 0.450–4.500)

## 2019-03-13 LAB — COMPREHENSIVE METABOLIC PANEL
ALT: 22 IU/L (ref 0–44)
AST: 32 IU/L (ref 0–40)
Albumin/Globulin Ratio: 2 (ref 1.2–2.2)
Albumin: 4.9 g/dL (ref 4.0–5.0)
Alkaline Phosphatase: 68 IU/L (ref 39–117)
BUN/Creatinine Ratio: 16 (ref 9–20)
BUN: 19 mg/dL (ref 6–24)
Bilirubin Total: 0.7 mg/dL (ref 0.0–1.2)
CO2: 23 mmol/L (ref 20–29)
Calcium: 10 mg/dL (ref 8.7–10.2)
Chloride: 102 mmol/L (ref 96–106)
Creatinine, Ser: 1.17 mg/dL (ref 0.76–1.27)
GFR calc Af Amer: 84 mL/min/{1.73_m2} (ref >59)
GFR calc non Af Amer: 72 mL/min/{1.73_m2} (ref >59)
Globulin, Total: 2.5 g/dL (ref 1.5–4.5)
Glucose: 93 mg/dL (ref 65–99)
Potassium: 5.2 mmol/L (ref 3.5–5.2)
Sodium: 139 mmol/L (ref 134–144)
Total Protein: 7.4 g/dL (ref 6.0–8.5)

## 2019-03-13 LAB — CBC
Hematocrit: 45.9 % (ref 37.5–51.0)
Hemoglobin: 15.2 g/dL (ref 13.0–17.7)
MCH: 30.6 pg (ref 26.6–33.0)
MCHC: 33.1 g/dL (ref 31.5–35.7)
MCV: 92 fL (ref 79–97)
Platelets: 203 10*3/uL (ref 150–450)
RBC: 4.97 x10E6/uL (ref 4.14–5.80)
RDW: 12.1 % (ref 11.6–15.4)
WBC: 5 10*3/uL (ref 3.4–10.8)

## 2019-03-13 LAB — LIPID PANEL
Chol/HDL Ratio: 3.6 ratio (ref 0.0–5.0)
Cholesterol, Total: 201 mg/dL — ABNORMAL HIGH (ref 100–199)
HDL: 56 mg/dL (ref >39)
LDL Chol Calc (NIH): 132 mg/dL — ABNORMAL HIGH (ref 0–99)
Triglycerides: 72 mg/dL (ref 0–149)
VLDL Cholesterol Cal: 13 mg/dL (ref 5–40)

## 2019-03-13 LAB — PSA: Prostate Specific Ag, Serum: 0.8 ng/mL (ref 0.0–4.0)

## 2019-03-13 LAB — VITAMIN D 25 HYDROXY (VIT D DEFICIENCY, FRACTURES): Vit D, 25-Hydroxy: 50.4 ng/mL (ref 30.0–100.0)

## 2019-03-20 NOTE — Addendum Note (Signed)
Addended by: Merri Ray R on: 03/20/2019 10:32 AM   Modules accepted: Orders

## 2019-03-24 NOTE — Addendum Note (Signed)
Addended by: Merri Ray R on: 03/24/2019 01:33 PM   Modules accepted: Orders

## 2019-03-26 ENCOUNTER — Ambulatory Visit
Admission: RE | Admit: 2019-03-26 | Discharge: 2019-03-26 | Disposition: A | Discharge status: Home / Self Care | Payer: BC Managed Care – PPO | Source: Ambulatory Visit | Attending: Family Medicine | Admitting: Family Medicine

## 2019-03-26 ENCOUNTER — Other Ambulatory Visit: Payer: BC Managed Care – PPO

## 2019-03-26 DIAGNOSIS — N433 Hydrocele, unspecified: Secondary | ICD-10-CM | POA: Diagnosis not present

## 2019-03-26 DIAGNOSIS — N5089 Other specified disorders of the male genital organs: Secondary | ICD-10-CM

## 2019-04-04 ENCOUNTER — Other Ambulatory Visit: Payer: Self-pay | Admitting: Family Medicine

## 2019-04-04 DIAGNOSIS — N433 Hydrocele, unspecified: Secondary | ICD-10-CM

## 2019-04-04 NOTE — Progress Notes (Signed)
See ultrasound results

## 2019-04-13 ENCOUNTER — Encounter: Payer: BC Managed Care – PPO | Admitting: Internal Medicine

## 2019-04-25 DIAGNOSIS — L02414 Cutaneous abscess of left upper limb: Secondary | ICD-10-CM | POA: Diagnosis not present

## 2019-04-27 DIAGNOSIS — N430 Encysted hydrocele: Secondary | ICD-10-CM | POA: Diagnosis not present

## 2019-05-29 DIAGNOSIS — R42 Dizziness and giddiness: Secondary | ICD-10-CM | POA: Diagnosis not present

## 2019-05-29 DIAGNOSIS — H903 Sensorineural hearing loss, bilateral: Secondary | ICD-10-CM | POA: Diagnosis not present

## 2019-05-29 DIAGNOSIS — H6121 Impacted cerumen, right ear: Secondary | ICD-10-CM | POA: Diagnosis not present

## 2019-06-09 DIAGNOSIS — L820 Inflamed seborrheic keratosis: Secondary | ICD-10-CM | POA: Diagnosis not present

## 2019-06-09 DIAGNOSIS — Z23 Encounter for immunization: Secondary | ICD-10-CM | POA: Diagnosis not present

## 2019-06-09 DIAGNOSIS — D485 Neoplasm of uncertain behavior of skin: Secondary | ICD-10-CM | POA: Diagnosis not present

## 2019-06-09 DIAGNOSIS — L409 Psoriasis, unspecified: Secondary | ICD-10-CM | POA: Diagnosis not present

## 2019-08-10 DIAGNOSIS — Z20828 Contact with and (suspected) exposure to other viral communicable diseases: Secondary | ICD-10-CM | POA: Diagnosis not present

## 2019-12-20 DIAGNOSIS — M62830 Muscle spasm of back: Secondary | ICD-10-CM | POA: Diagnosis not present

## 2019-12-20 DIAGNOSIS — M549 Dorsalgia, unspecified: Secondary | ICD-10-CM | POA: Diagnosis not present

## 2020-01-04 DIAGNOSIS — M546 Pain in thoracic spine: Secondary | ICD-10-CM | POA: Diagnosis not present

## 2020-01-04 DIAGNOSIS — M542 Cervicalgia: Secondary | ICD-10-CM | POA: Diagnosis not present

## 2020-01-04 DIAGNOSIS — M545 Low back pain: Secondary | ICD-10-CM | POA: Diagnosis not present

## 2020-01-06 DIAGNOSIS — Z7409 Other reduced mobility: Secondary | ICD-10-CM | POA: Diagnosis not present

## 2020-01-12 DIAGNOSIS — M542 Cervicalgia: Secondary | ICD-10-CM | POA: Diagnosis not present

## 2020-01-21 DIAGNOSIS — M542 Cervicalgia: Secondary | ICD-10-CM | POA: Diagnosis not present

## 2020-01-27 DIAGNOSIS — M542 Cervicalgia: Secondary | ICD-10-CM | POA: Diagnosis not present

## 2020-02-03 DIAGNOSIS — M542 Cervicalgia: Secondary | ICD-10-CM | POA: Diagnosis not present

## 2020-02-05 DIAGNOSIS — M542 Cervicalgia: Secondary | ICD-10-CM | POA: Diagnosis not present

## 2020-02-09 DIAGNOSIS — M542 Cervicalgia: Secondary | ICD-10-CM | POA: Diagnosis not present

## 2020-03-07 ENCOUNTER — Ambulatory Visit: Payer: BC Managed Care – PPO | Admitting: Registered Nurse

## 2020-03-07 ENCOUNTER — Other Ambulatory Visit: Payer: Self-pay

## 2020-03-07 ENCOUNTER — Encounter: Payer: Self-pay | Admitting: Registered Nurse

## 2020-03-07 VITALS — BP 128/82 | HR 81 | Temp 98.0°F | Resp 18 | Ht 69.0 in | Wt 196.2 lb

## 2020-03-07 DIAGNOSIS — Z1211 Encounter for screening for malignant neoplasm of colon: Secondary | ICD-10-CM | POA: Diagnosis not present

## 2020-03-07 DIAGNOSIS — R002 Palpitations: Secondary | ICD-10-CM | POA: Diagnosis not present

## 2020-03-07 NOTE — Patient Instructions (Signed)
° ° ° °  If you have lab work done today you will be contacted with your lab results within the next 2 weeks.  If you have not heard from us then please contact us. The fastest way to get your results is to register for My Chart. ° ° °IF you received an x-ray today, you will receive an invoice from [facility]. Please contact [facility] at [phone] with questions or concerns regarding your invoice.  ° °IF you received labwork today, you will receive an invoice from [facility]. Please contact [facility] at [phone] with questions or concerns regarding your invoice.  ° °Our billing staff will not be able to assist you with questions regarding bills from these companies. ° °You will be contacted with the lab results as soon as they are available. The fastest way to get your results is to activate your My Chart account. Instructions are located on the last page of this paperwork. If you have not heard from us regarding the results in 2 weeks, please contact this office. °  ° ° ° °

## 2020-03-08 ENCOUNTER — Telehealth: Payer: Self-pay | Admitting: Registered Nurse

## 2020-03-08 ENCOUNTER — Encounter: Payer: Self-pay | Admitting: Registered Nurse

## 2020-03-08 LAB — CBC
Hematocrit: 43 % (ref 37.5–51.0)
Hemoglobin: 14.6 g/dL (ref 13.0–17.7)
MCH: 30.9 pg (ref 26.6–33.0)
MCHC: 34 g/dL (ref 31.5–35.7)
MCV: 91 fL (ref 79–97)
Platelets: 212 10*3/uL (ref 150–450)
RBC: 4.73 x10E6/uL (ref 4.14–5.80)
RDW: 12.3 % (ref 11.6–15.4)
WBC: 5.9 10*3/uL (ref 3.4–10.8)

## 2020-03-08 LAB — COMPREHENSIVE METABOLIC PANEL
ALT: 21 IU/L (ref 0–44)
AST: 26 IU/L (ref 0–40)
Albumin/Globulin Ratio: 1.8 (ref 1.2–2.2)
Albumin: 4.6 g/dL (ref 3.8–4.9)
Alkaline Phosphatase: 62 IU/L (ref 44–121)
BUN/Creatinine Ratio: 14 (ref 9–20)
BUN: 15 mg/dL (ref 6–24)
Bilirubin Total: 0.3 mg/dL (ref 0.0–1.2)
CO2: 27 mmol/L (ref 20–29)
Calcium: 9.3 mg/dL (ref 8.7–10.2)
Chloride: 101 mmol/L (ref 96–106)
Creatinine, Ser: 1.08 mg/dL (ref 0.76–1.27)
GFR calc Af Amer: 91 mL/min/{1.73_m2} (ref >59)
GFR calc non Af Amer: 79 mL/min/{1.73_m2} (ref >59)
Globulin, Total: 2.6 g/dL (ref 1.5–4.5)
Glucose: 100 mg/dL — ABNORMAL HIGH (ref 65–99)
Potassium: 4.5 mmol/L (ref 3.5–5.2)
Sodium: 137 mmol/L (ref 134–144)
Total Protein: 7.2 g/dL (ref 6.0–8.5)

## 2020-03-08 LAB — TSH: TSH: 2.07 u[IU]/mL (ref 0.450–4.500)

## 2020-03-08 NOTE — Telephone Encounter (Signed)
Pt is wanting a cb concerning a lab result he thinks was missed yesterday during his lab draw at [facility]. He thinks he needs a test for Troponin. Please advise at [phone].

## 2020-03-08 NOTE — Telephone Encounter (Signed)
Spoke with patient who stated he was not having any chest pains and wanted proceed w/ a troponin level because of his upcoming covid vaccine. He was advised to follow up with urgent care or ER for any CP or to have a troponin levels drawn per provider's recommendations. Pt verbalized understanding.

## 2020-03-10 DIAGNOSIS — R079 Chest pain, unspecified: Secondary | ICD-10-CM | POA: Diagnosis not present

## 2020-03-10 NOTE — Telephone Encounter (Signed)
Pt sent message and asked to be referred to cards due to EKG report is this okay referral to make? I can send if you will be okay cosigning

## 2020-03-14 NOTE — Telephone Encounter (Signed)
We can absolutely place this referral - if you wouldn't mind putting it in I can cosign   Thank you so much  Jari Sportsman, NP

## 2020-03-14 NOTE — Telephone Encounter (Signed)
Do you approve of the referral for this pt for cards due to abnormal ekg?

## 2020-03-15 ENCOUNTER — Other Ambulatory Visit: Payer: Self-pay

## 2020-03-15 DIAGNOSIS — R002 Palpitations: Secondary | ICD-10-CM

## 2020-03-15 NOTE — Telephone Encounter (Signed)
Referral placed, spoke with pt verbalized understanding. Pt has requested Dr. Verne Carrow from [facility]

## 2020-03-21 ENCOUNTER — Encounter: Payer: Self-pay | Admitting: Registered Nurse

## 2020-03-21 NOTE — Progress Notes (Signed)
Acute Office Visit  Subjective:    Patient ID: Corey [name], male    DOB: [birth year], 49 y.o.   MRN: [id number]  Chief Complaint  Patient presents with  . Palpitations    Patient states he has been having some pain on the left side chest feels like muscles twitching but wanted to come in to discuss.    HPI Patient is in today for palpitations Unsure if palpitations of msk etiology- feels like twitching on L side of chest. Feels superficial. Comes and goes. No systemic CV symptoms including lightheadedness, dizziness, shob, doe, headaches, or visual changes.  No hx of adverse CV events Does note family hx of hld, htn, and TIA in mother.  Concern for early cardiac death   Past Medical History:  Diagnosis Date  . Congenital hearing loss    B hearing aides    Past Surgical History:  Procedure Laterality Date  . ARTHROSCOPIC REPAIR ACL      Family History  Problem Relation Age of Onset  . Cancer Mother 42       lung cancer  . Hyperlipidemia Mother   . Hypertension Mother   . Transient ischemic attack Mother   . Alcohol abuse Father   . Arthritis Brother     Social History   Socioeconomic History  . Marital status: Married    Spouse name: Harriett Sine  . Number of children: 0  . Years of education: Not on file  . Highest education level: Not on file  Occupational History  . Not on file  Tobacco Use  . Smoking status: Former Games developer  . Smokeless tobacco: Never Used  Vaping Use  . Vaping Use: Never used  Substance and Sexual Activity  . Alcohol use: No  . Drug use: No  . Sexual activity: Yes    Partners: Female  Other Topics Concern  . Not on file  Social History Narrative   Marital status: married since 1998; moved from [place] in 2017      Children: 2 stepchildren (30, 32); 1 grandchild      Lives: with wife, son.      Employment:  Clinical biochemist      Tobacco:  Never      Alcohol: none      Drugs: none; marijuana/crack/cocaine/PCP/heroine; previous iv drug  use as teenager; age [age].      Exercise:  Riding bike five days per week; 30 minute bike ride at lunch each day      Seatbelt: 100%; no texting         Social Determinants of Health   Financial Resource Strain:   . Difficulty of Paying Living Expenses: Not on file  Food Insecurity:   . Worried About Programme researcher, broadcasting/film/video in the Last Year: Not on file  . Ran Out of Food in the Last Year: Not on file  Transportation Needs:   . Lack of Transportation (Medical): Not on file  . Lack of Transportation (Non-Medical): Not on file  Physical Activity:   . Days of Exercise per Week: Not on file  . Minutes of Exercise per Session: Not on file  Stress:   . Feeling of Stress : Not on file  Social Connections:   . Frequency of Communication with Friends and Family: Not on file  . Frequency of Social Gatherings with Friends and Family: Not on file  . Attends Religious Services: Not on file  . Active Member of Clubs or Organizations: Not on file  .  Attends Banker Meetings: Not on file  . Marital Status: Not on file  Intimate Partner Violence:   . Fear of Current or Ex-Partner: Not on file  . Emotionally Abused: Not on file  . Physically Abused: Not on file  . Sexually Abused: Not on file    No outpatient medications prior to visit.   No facility-administered medications prior to visit.    No Known Allergies  Review of Systems  Constitutional: Negative.   HENT: Negative.   Eyes: Negative.   Respiratory: Negative.   Cardiovascular: Negative.   Gastrointestinal: Negative.   Genitourinary: Negative.   Musculoskeletal: Negative.   Skin: Negative.   Neurological: Negative.   Psychiatric/Behavioral: Negative.        Objective:    Physical Exam Constitutional:      Appearance: Normal appearance. He is normal weight.  Cardiovascular:     Rate and Rhythm: Normal rate and regular rhythm.     Pulses: Normal pulses.     Heart sounds: Normal heart sounds. No murmur  heard.   Pulmonary:     Effort: Pulmonary effort is normal. No respiratory distress.     Breath sounds: Normal breath sounds. No stridor. No wheezing, rhonchi or rales.  Chest:     Chest wall: No tenderness.  Neurological:     Mental Status: He is alert.     BP 128/82   Pulse 81   Temp 98 F (36.7 C) (Temporal)   Resp 18   Ht 5\' 9"  (1.753 m)   Wt 196 lb 3.2 oz (89 kg)   SpO2 96%   BMI 28.97 kg/m  Wt Readings from Last 3 Encounters:  03/07/20 196 lb 3.2 oz (89 kg)  03/12/19 190 lb 9.6 oz (86.5 kg)  06/21/17 180 lb 9.6 oz (81.9 kg)    Health Maintenance Due  Topic Date Due  . COLONOSCOPY  Never done  . COVID-19 Vaccine (2 - Pfizer 2-dose series) 03/14/2020    There are no preventive care reminders to display for this patient.   Lab Results  Component Value Date   TSH 2.070 03/07/2020   Lab Results  Component Value Date   WBC 5.9 03/07/2020   HGB 14.6 03/07/2020   HCT 43.0 03/07/2020   MCV 91 03/07/2020   PLT 212 03/07/2020   Lab Results  Component Value Date   NA 137 03/07/2020   K 4.5 03/07/2020   CO2 27 03/07/2020   GLUCOSE 100 (H) 03/07/2020   BUN 15 03/07/2020   CREATININE 1.08 03/07/2020   BILITOT 0.3 03/07/2020   ALKPHOS 62 03/07/2020   AST 26 03/07/2020   ALT 21 03/07/2020   PROT 7.2 03/07/2020   ALBUMIN 4.6 03/07/2020   CALCIUM 9.3 03/07/2020   Lab Results  Component Value Date   CHOL 201 (H) 03/12/2019   Lab Results  Component Value Date   HDL 56 03/12/2019   Lab Results  Component Value Date   LDLCALC 132 (H) 03/12/2019   Lab Results  Component Value Date   TRIG 72 03/12/2019   Lab Results  Component Value Date   CHOLHDL 3.6 03/12/2019   Lab Results  Component Value Date   HGBA1C 5.4 01/14/2016       Assessment & Plan:   Problem List Items Addressed This Visit    None    Visit Diagnoses    Heart palpitations    -  Primary   Relevant Orders   EKG 12-Lead (Completed)   Comprehensive  metabolic panel (Completed)     TSH (Completed)   CBC (Completed)   Special screening for malignant neoplasms, colon       Relevant Orders   Ambulatory referral to Gastroenterology       No orders of the defined types were placed in this encounter.  PLAN  Unremarkable exam  ekg shows no acute changes. Compared to ekg on 01/14/16, no substantial changes noted.  Will collect labs, but suspect msk etiology at this time  Return prn  Referral to GI for colonoscopy  Patient encouraged to call clinic with any questions, comments, or concerns.  Janeece Agee, NP

## 2020-04-02 IMAGING — US US PELVIS LIMITED
1 series · 7 of 7 positions shown · non-contrast
Comparison: Comparison made with concomitant scrotal ultrasound.

CLINICAL DATA: Initial evaluation for possible hernia, left-sided.

EXAM:
LIMITED ULTRASOUND OF PELVIS
TECHNIQUE: Limited transabdominal ultrasound examination of the pelvis was
performed.

[Series 1: us pelvis limited · 0.06mm/px · 7 acquisitions, 7 frames shown]
[im 1/7]
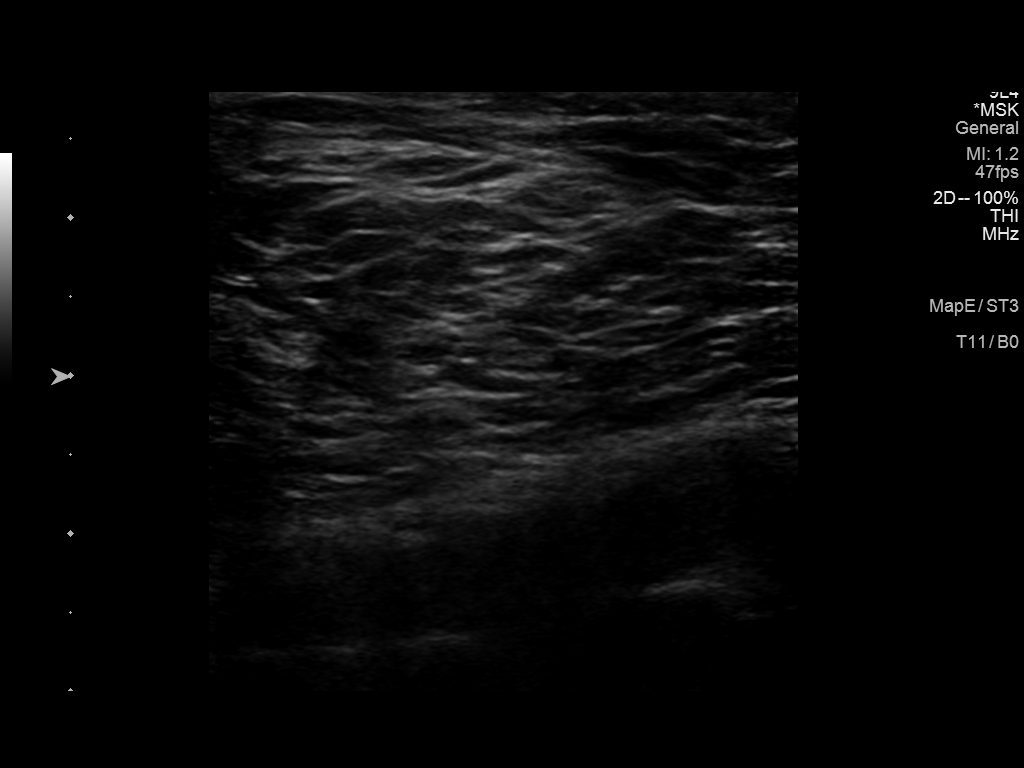
[im 2/7]
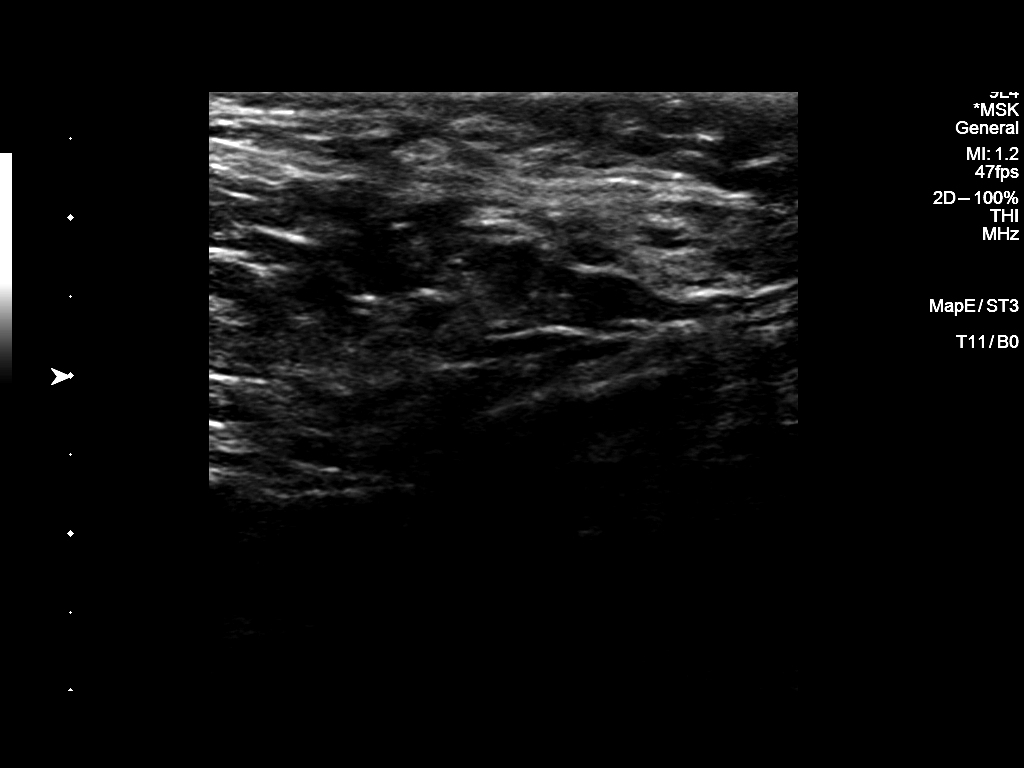
[im 3/7]
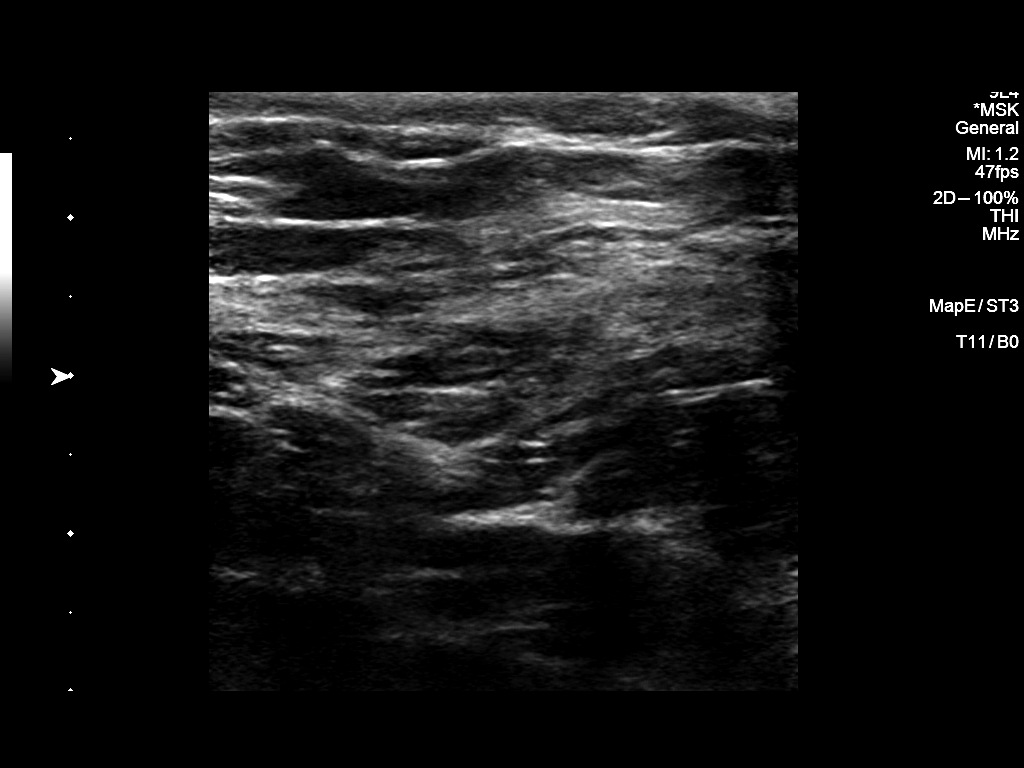
[im 4/7]
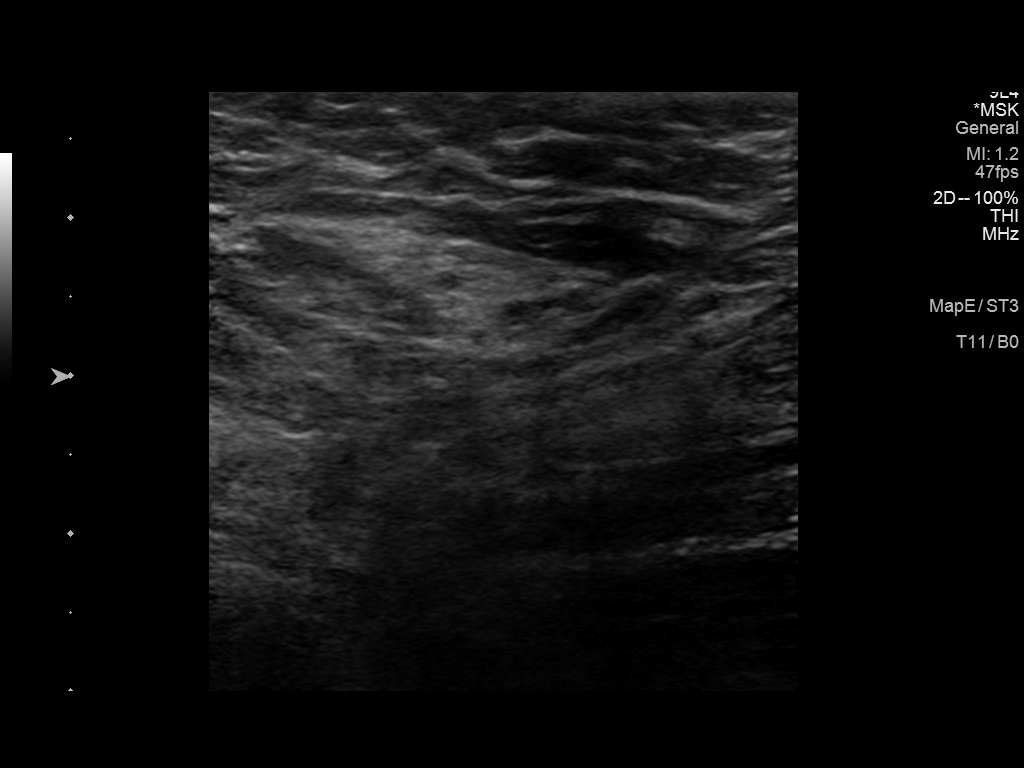
[im 5/7]
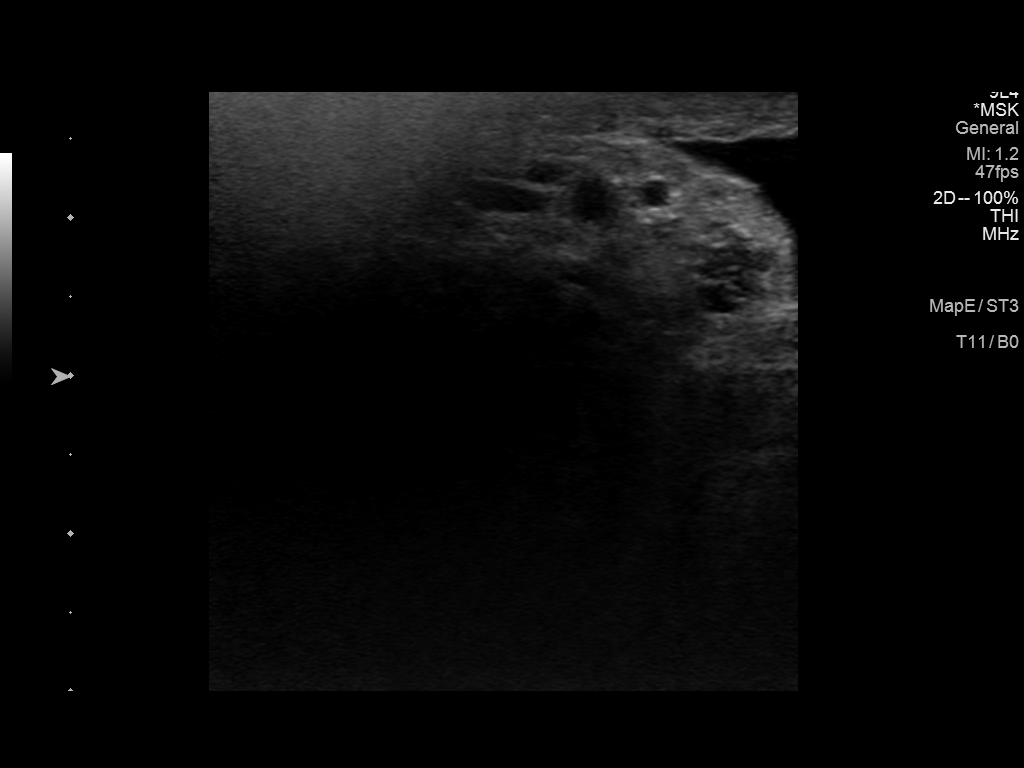
[im 6/7]
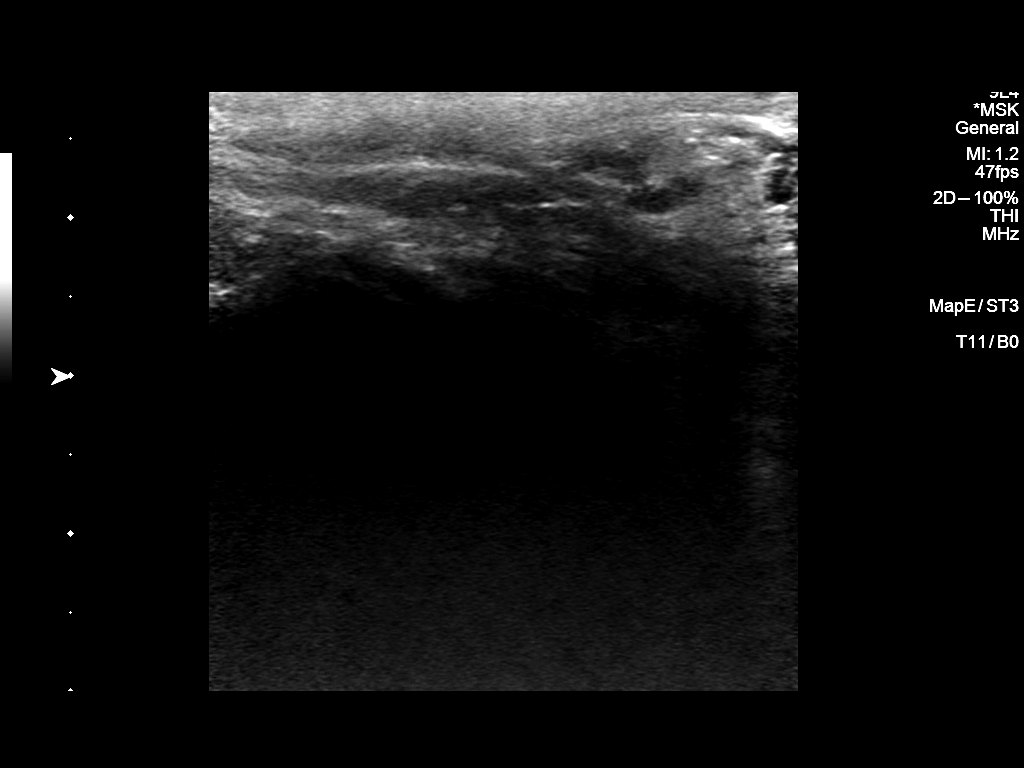
[im 7/7]
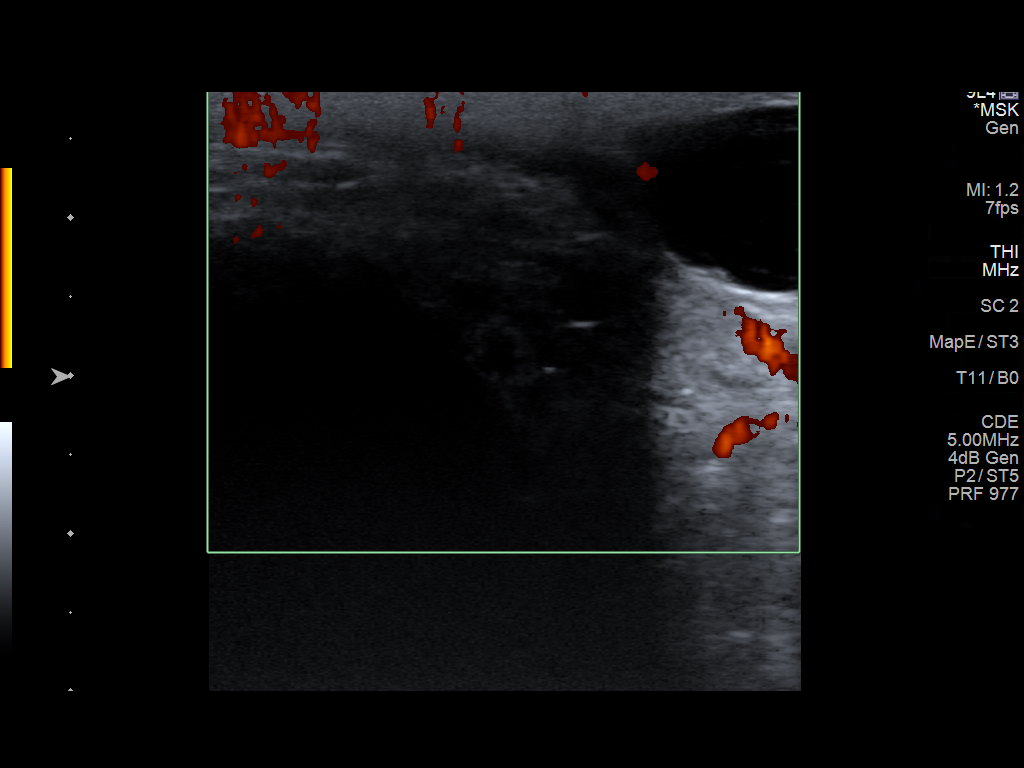

[7 of 7 positions shown; findings below may reference images not displayed]

FINDINGS: Targeted ultrasound of the left inguinal region was performed.
Ultrasound demonstrates no visible hernia or other abnormality. No
soft tissue mass, collection, or visible adenopathy.
IMPRESSION: Negative ultrasound of the left inguinal region. No hernia
identified.

## 2020-04-18 ENCOUNTER — Ambulatory Visit: Payer: BC Managed Care – PPO | Admitting: Cardiovascular Disease

## 2020-04-18 ENCOUNTER — Encounter: Payer: Self-pay | Admitting: Gastroenterology

## 2020-04-18 ENCOUNTER — Encounter: Payer: Self-pay | Admitting: Cardiovascular Disease

## 2020-04-18 ENCOUNTER — Other Ambulatory Visit: Payer: Self-pay

## 2020-04-18 VITALS — BP 126/68 | HR 84 | Ht 69.0 in | Wt 201.0 lb

## 2020-04-18 DIAGNOSIS — R0789 Other chest pain: Secondary | ICD-10-CM

## 2020-04-18 DIAGNOSIS — E7800 Pure hypercholesterolemia, unspecified: Secondary | ICD-10-CM | POA: Diagnosis not present

## 2020-04-18 DIAGNOSIS — I2510 Atherosclerotic heart disease of native coronary artery without angina pectoris: Secondary | ICD-10-CM

## 2020-04-18 LAB — LIPID PANEL
Chol/HDL Ratio: 4.7 ratio (ref 0.0–5.0)
Cholesterol, Total: 221 mg/dL — ABNORMAL HIGH (ref 100–199)
HDL: 47 mg/dL (ref >39)
LDL Chol Calc (NIH): 147 mg/dL — ABNORMAL HIGH (ref 0–99)
Triglycerides: 148 mg/dL (ref 0–149)
VLDL Cholesterol Cal: 27 mg/dL (ref 5–40)

## 2020-04-18 NOTE — Progress Notes (Signed)
Chief Complaint  Patient presents with  . New Patient (Initial Visit)    chest pain   History of Present Illness: 49 yo male with history mild CAD by coronary CTA in 2010 referred today as a new consult by Dr. Neva Seat, [facility])  for evaluation of chest pain. EKG in primary care 03/07/20 with sinus rhythm, incomplete RBBB. He had a coronary CTA in 2010 in the [place] which showed plaque in the distal LAD. Echo in 2009 in the [place] with normal LV function, trivial AI, trivial MR. He describes one episode of tingling/pain in his chest at rest while lying in bed. This lasted for several minutes with no associated dyspnea or dizziness, N/V. He is very active. He has had no exertional chest pain.   Primary Care Physician: Shade Flood, MD   Past Medical History:  Diagnosis Date  . CAD (coronary artery disease)   . Congenital hearing loss    B hearing aides    Past Surgical History:  Procedure Laterality Date  . ARTHROSCOPIC REPAIR ACL    . SHOULDER SURGERY      No current outpatient medications on file.   No current facility-administered medications for this visit.    No Known Allergies  Social History   Socioeconomic History  . Marital status: Married    Spouse name: Harriett Sine  . Number of children: 0  . Years of education: Not on file  . Highest education level: Not on file  Occupational History  . Occupation: Clinical biochemist  Tobacco Use  . Smoking status: Former Games developer  . Smokeless tobacco: Never Used  Vaping Use  . Vaping Use: Never used  Substance and Sexual Activity  . Alcohol use: No  . Drug use: No  . Sexual activity: Yes    Partners: Female  Other Topics Concern  . Not on file  Social History Narrative   Marital status: married since 1998; moved from [place] in 2017      Children: 2 stepchildren (30, 71); 1 grandchild      Lives: with wife, son.      Employment:  Clinical biochemist      Tobacco:  Never      Alcohol: none      Drugs: none;  marijuana/crack/cocaine/PCP/heroine; previous iv drug use as teenager; age [age].      Exercise:  Riding bike five days per week; 30 minute bike ride at lunch each day      Seatbelt: 100%; no texting         Social Determinants of Health   Financial Resource Strain:   . Difficulty of Paying Living Expenses: Not on file  Food Insecurity:   . Worried About Programme researcher, broadcasting/film/video in the Last Year: Not on file  . Ran Out of Food in the Last Year: Not on file  Transportation Needs:   . Lack of Transportation (Medical): Not on file  . Lack of Transportation (Non-Medical): Not on file  Physical Activity:   . Days of Exercise per Week: Not on file  . Minutes of Exercise per Session: Not on file  Stress:   . Feeling of Stress : Not on file  Social Connections:   . Frequency of Communication with Friends and Family: Not on file  . Frequency of Social Gatherings with Friends and Family: Not on file  . Attends Religious Services: Not on file  . Active Member of Clubs or Organizations: Not on file  . Attends Banker  Meetings: Not on file  . Marital Status: Not on file  Intimate Partner Violence:   . Fear of Current or Ex-Partner: Not on file  . Emotionally Abused: Not on file  . Physically Abused: Not on file  . Sexually Abused: Not on file    Family History  Problem Relation Age of Onset  . Cancer Mother 40       lung cancer  . Hyperlipidemia Mother   . Hypertension Mother   . Transient ischemic attack Mother   . Alcohol abuse Father   . Arthritis Brother     Review of Systems:  As stated in the HPI and otherwise negative.   BP 126/68   Pulse 84   Ht 5\' 9"  (1.753 m)   Wt 201 lb (91.2 kg)   SpO2 95%   BMI 29.68 kg/m   Physical Examination: General: Well developed, well nourished, NAD  HEENT: OP clear, mucus membranes moist  SKIN: warm, dry. No rashes. Neuro: No focal deficits  Musculoskeletal: Muscle strength 5/5 all ext  Psychiatric: Mood and affect normal   Neck: No JVD, no carotid bruits, no thyromegaly, no lymphadenopathy.  Lungs:Clear bilaterally, no wheezes, rhonci, crackles Cardiovascular: Regular rate and rhythm. No murmurs, gallops or rubs. Abdomen:Soft. Bowel sounds present. Non-tender.  Extremities: No lower extremity edema. Pulses are 2 + in the bilateral DP/PT.  EKG:  EKG is not ordered today. The ekg ordered today demonstrates  EKG from 03/07/20 reviewed by me today and shows sinus with incomplete RBBB  Recent Labs: 03/07/2020: ALT 21; BUN 15; Creatinine, Ser 1.08; Hemoglobin 14.6; Platelets 212; Potassium 4.5; Sodium 137; TSH 2.070   Lipid Panel    Component Value Date/Time   CHOL 201 (H) 03/12/2019 1222   TRIG 72 03/12/2019 1222   HDL 56 03/12/2019 1222   CHOLHDL 3.6 03/12/2019 1222   CHOLHDL 3.6 01/14/2016 1449   VLDL 12 01/14/2016 1449   LDLCALC 132 (H) 03/12/2019 1222     Wt Readings from Last 3 Encounters:  04/18/20 201 lb (91.2 kg)  03/07/20 196 lb 3.2 oz (89 kg)  03/12/19 190 lb 9.6 oz (86.5 kg)      Assessment and Plan:   1. CAD/Atypical chest pain: He is known to have mild CAD by coronary CTA in 2010 in 2011. He has had no exertional chest pain. His isolated episode of chest pain does not sound cardiac related. I have suggested that he consider a daily ASA and statin given his CAD. He would prefer not to take ASA or a statin. I will arrange an echo to assess LV function.   2. Hyperlipidemia: LDL 132 in 2020 with HDL 56. Will repeat now. With CAD, goal LDL of 70. Will repeat lipids now. If LDL over 70, will discuss statin therapy but he likely will not be willing to start this.   Current medicines are reviewed at length with the patient today.  The patient does not have concerns regarding medicines.  The following changes have been made:  no change  Labs/ tests ordered today include:   Orders Placed This Encounter  Procedures  . Lipid panel  . ECHOCARDIOGRAM COMPLETE     Disposition:   FU with me  in one year.    Signed, 2021, MD 04/18/2020 10:15 AM    [facility] [address] [place], [place], [place]  [place] Phone: [phone]; Fax: [phone]

## 2020-04-18 NOTE — Patient Instructions (Signed)
Medication Instructions:  Your physician recommends that you continue on your current medications as directed. Please refer to the Current Medication list given to you today.  *If you need a refill on your cardiac medications before your next appointment, please call your pharmacy*  Lab Work: TODAY: Fasting lipids If you have labs (blood work) drawn today and your tests are completely normal, you will receive your results only by:  MyChart Message (if you have MyChart) OR  A paper copy in the mail If you have any lab test that is abnormal or we need to change your treatment, we will call you to review the results.   Testing/Procedures: Your physician has requested that you have an echocardiogram. Echocardiography is a painless test that uses sound waves to create images of your heart. It provides your doctor with information about the size and shape of your heart and how well your hearts chambers and valves are working. This procedure takes approximately one hour. There are no restrictions for this procedure.  Follow-Up: At [facility], you and your health needs are our priority.  As part of our continuing mission to provide you with exceptional heart care, we have created designated Provider Care Teams.  These Care Teams include your primary Cardiologist (physician) and Advanced Practice Providers (APPs -  Physician Assistants and Nurse Practitioners) who all work together to provide you with the care you need, when you need it.  Your next appointment:   1 year(s)  The format for your next appointment:   In Person  Provider:   You may see Verne Carrow, MD or one of the following Advanced Practice Providers on your designated Care Team:    Ronie Spies, PA-C  Jacolyn Reedy, PA-C

## 2020-05-11 ENCOUNTER — Ambulatory Visit (HOSPITAL_COMMUNITY): Payer: BC Managed Care – PPO | Attending: Cardiology

## 2020-05-11 ENCOUNTER — Other Ambulatory Visit: Payer: Self-pay

## 2020-05-11 DIAGNOSIS — I2510 Atherosclerotic heart disease of native coronary artery without angina pectoris: Secondary | ICD-10-CM | POA: Diagnosis not present

## 2020-05-11 LAB — ECHOCARDIOGRAM COMPLETE
Area-P 1/2: 2.48 cm2
S' Lateral: 2.4 cm

## 2020-06-20 ENCOUNTER — Encounter: Payer: BC Managed Care – PPO | Admitting: Gastroenterology

## 2021-06-09 ENCOUNTER — Other Ambulatory Visit: Payer: Self-pay

## 2021-06-09 ENCOUNTER — Encounter: Payer: Self-pay | Admitting: Cardiovascular Disease

## 2021-06-09 ENCOUNTER — Ambulatory Visit: Payer: Managed Care, Other (non HMO) | Admitting: Cardiovascular Disease

## 2021-06-09 VITALS — BP 106/70 | HR 83 | Ht 69.0 in | Wt 188.6 lb

## 2021-06-09 DIAGNOSIS — I2510 Atherosclerotic heart disease of native coronary artery without angina pectoris: Secondary | ICD-10-CM | POA: Diagnosis not present

## 2021-06-09 DIAGNOSIS — E7800 Pure hypercholesterolemia, unspecified: Secondary | ICD-10-CM | POA: Diagnosis not present

## 2021-06-09 NOTE — Progress Notes (Signed)
Chief Complaint  Patient presents with   Follow-up    CAD   History of Present Illness: 51 yo male with history mild CAD by coronary CTA in 2010 who is here today for follow up. I met him as a new patient in November 2021 for evaluation of chest pain. EKG in primary care 03/07/20 with sinus rhythm, incomplete RBBB. He had a coronary CTA in 2010 in the [place] which showed plaque in the distal LAD. Echo in 2009 in the [place] with normal LV function, trivial AI, trivial MR. He describes one episode of tingling/pain in his chest at rest while lying in bed. This lasted for several minutes with no associated dyspnea or dizziness, N/V. He is very active. He has had no exertional chest pain. Echo December 2021 with LVEF=60-65%, no significant valve disease.   He is here today for follow up. The patient denies any chest pain, dyspnea, palpitations, lower extremity edema, orthopnea, PND, dizziness, near syncope or syncope.   Primary Care Physician: Wendie Agreste, MD  Past Medical History:  Diagnosis Date   CAD (coronary artery disease)    Congenital hearing loss    B hearing aides    Past Surgical History:  Procedure Laterality Date   ARTHROSCOPIC REPAIR ACL     SHOULDER SURGERY      Current Outpatient Medications  Medication Sig Dispense Refill   Multiple Vitamin (MULTIVITAMIN) tablet Take 1 tablet by mouth daily.     No current facility-administered medications for this visit.    No Known Allergies  Social History   Socioeconomic History   Marital status: Married    Spouse name: Izora Gala   Number of children: 0   Years of education: Not on file   Highest education level: Not on file  Occupational History   Occupation: Investment banker, corporate  Tobacco Use   Smoking status: Former   Smokeless tobacco: Never  Scientific laboratory technician Use: Never used  Substance and Sexual Activity   Alcohol use: No   Drug use: No   Sexual activity: Yes    Partners: Female  Other Topics Concern    Not on file  Social History Narrative   Marital status: married since 1998; moved from [place] in 2017      Children: 2 stepchildren (30, 95); 1 grandchild      Lives: with wife, son.      Employment:  Investment banker, corporate      Tobacco:  Never      Alcohol: none      Drugs: none; marijuana/crack/cocaine/PCP/heroine; previous iv drug use as teenager; age [age].      Exercise:  Riding bike five days per week; 30 minute bike ride at lunch each day      Seatbelt: 100%; no texting         Social Determinants of Radio broadcast assistant Strain: Not on file  Food Insecurity: Not on file  Transportation Needs: Not on file  Physical Activity: Not on file  Stress: Not on file  Social Connections: Not on file  Intimate Partner Violence: Not on file    Family History  Problem Relation Age of Onset   Cancer Mother 88       lung cancer   Hyperlipidemia Mother    Hypertension Mother    Transient ischemic attack Mother    Alcohol abuse Father    Arthritis Brother     Review of Systems:  As stated in the HPI and otherwise  negative.   BP 106/70    Pulse 83    Ht 5' 9"  (1.753 m)    Wt 188 lb 9.6 oz (85.5 kg)    SpO2 98%    BMI 27.85 kg/m   Physical Examination: General: Well developed, well nourished, NAD  HEENT: OP clear, mucus membranes moist  SKIN: warm, dry. No rashes. Neuro: No focal deficits  Musculoskeletal: Muscle strength 5/5 all ext  Psychiatric: Mood and affect normal  Neck: No JVD, no carotid bruits, no thyromegaly, no lymphadenopathy.  Lungs:Clear bilaterally, no wheezes, rhonci, crackles Cardiovascular: Regular rate and rhythm. No murmurs, gallops or rubs. Abdomen:Soft. Bowel sounds present. Non-tender.  Extremities: No lower extremity edema. Pulses are 2 + in the bilateral DP/PT.  EKG:  EKG is ordered today. The ekg ordered today demonstrates sinus  Echo December 2021:   1. Left ventricular ejection fraction, by estimation, is 60 to 65%. The  left ventricle has  normal function. The left ventricle has no regional  wall motion abnormalities.   2. Left ventricular diastolic parameters were normal.   3. Right ventricular systolic function is normal. The right ventricular  size is normal.   4. The mitral valve is normal in structure. Trivial mitral valve  regurgitation.   5. The aortic valve is tricuspid. Aortic valve regurgitation is trivial.  No aortic stenosis is present.   6. The inferior vena cava is normal in size with greater than 50%  respiratory variability, suggesting right atrial pressure of 3 mmHg.   Recent Labs: No results found for requested labs within last 8760 hours.   Lipid Panel    Component Value Date/Time   CHOL 221 (H) 04/18/2020 1009   TRIG 148 04/18/2020 1009   HDL 47 04/18/2020 1009   CHOLHDL 4.7 04/18/2020 1009   CHOLHDL 3.6 01/14/2016 1449   VLDL 12 01/14/2016 1449   LDLCALC 147 (H) 04/18/2020 1009     Wt Readings from Last 3 Encounters:  06/09/21 188 lb 9.6 oz (85.5 kg)  04/18/20 201 lb (91.2 kg)  03/07/20 196 lb 3.2 oz (89 kg)      Assessment and Plan:   1. CAD/Atypical chest pain: He is known to have mild CAD by coronary CTA in 2010 in [place]. Echo December 2021 with normal LV function. He has had no exertional chest pain. I have suggested that he consider a daily ASA and statin given his CAD. He would prefer not to take ASA or a statin.   2. Hyperlipidemia: He reports having LDL of 122 in September 2022. He refuses to try a statin. He would like to try diet and exercise. I have told him that his goal LDL is under 70  Current medicines are reviewed at length with the patient today.  The patient does not have concerns regarding medicines.  The following changes have been made:  no change  Labs/ tests ordered today include:   Orders Placed This Encounter  Procedures   EKG 12-Lead    Disposition:   F/U with me in one year.    Signed, Lauree Chandler, MD 06/09/2021 4:28 PM    [facility] [place], [place], [place]  [address] Phone: [phone]; Fax: [phone]

## 2021-06-09 NOTE — Patient Instructions (Signed)

## 2021-11-08 ENCOUNTER — Encounter: Payer: Self-pay | Admitting: Family Medicine

## 2021-11-08 ENCOUNTER — Ambulatory Visit: Payer: Managed Care, Other (non HMO) | Admitting: Family Medicine

## 2021-11-08 ENCOUNTER — Ambulatory Visit (HOSPITAL_BASED_OUTPATIENT_CLINIC_OR_DEPARTMENT_OTHER)
Admission: RE | Admit: 2021-11-08 | Discharge: 2021-11-08 | Disposition: A | Discharge status: Home / Self Care | Payer: Managed Care, Other (non HMO) | Source: Ambulatory Visit | Attending: Family Medicine | Admitting: Family Medicine

## 2021-11-08 VITALS — BP 104/68 | HR 77 | Ht 69.0 in | Wt 183.0 lb

## 2021-11-08 DIAGNOSIS — R052 Subacute cough: Secondary | ICD-10-CM | POA: Insufficient documentation

## 2021-11-08 DIAGNOSIS — R634 Abnormal weight loss: Secondary | ICD-10-CM

## 2021-11-08 DIAGNOSIS — Z1211 Encounter for screening for malignant neoplasm of colon: Secondary | ICD-10-CM | POA: Diagnosis not present

## 2021-11-08 LAB — COMPREHENSIVE METABOLIC PANEL
ALT: 13 U/L (ref 0–53)
AST: 20 U/L (ref 0–37)
Albumin: 4.4 g/dL (ref 3.5–5.2)
Alkaline Phosphatase: 71 U/L (ref 39–117)
BUN: 15 mg/dL (ref 6–23)
CO2: 33 mEq/L — ABNORMAL HIGH (ref 19–32)
Calcium: 9.7 mg/dL (ref 8.4–10.5)
Chloride: 102 mEq/L (ref 96–112)
Creatinine, Ser: 1.16 mg/dL (ref 0.40–1.50)
GFR: 72.01 mL/min (ref >60.00)
Glucose, Bld: 97 mg/dL (ref 70–99)
Potassium: 5 mEq/L (ref 3.5–5.1)
Sodium: 139 mEq/L (ref 135–145)
Total Bilirubin: 0.7 mg/dL (ref 0.2–1.2)
Total Protein: 7.3 g/dL (ref 6.0–8.3)

## 2021-11-08 LAB — CBC
HCT: 44.6 % (ref 39.0–52.0)
Hemoglobin: 14.4 g/dL (ref 13.0–17.0)
MCHC: 32.4 g/dL (ref 30.0–36.0)
MCV: 92 fl (ref 78.0–100.0)
Platelets: 230 10*3/uL (ref 150.0–400.0)
RBC: 4.84 Mil/uL (ref 4.22–5.81)
RDW: 13.5 % (ref 11.5–15.5)
WBC: 6.7 10*3/uL (ref 4.0–10.5)

## 2021-11-08 LAB — TSH: TSH: 3.49 u[IU]/mL (ref 0.35–5.50)

## 2021-11-08 NOTE — Patient Instructions (Signed)
Thank you for choosing [facility] for your Primary Care needs. I am excited for the opportunity to partner with you to meet your health care goals. It was a pleasure meeting you today!   Information on diet, exercise, and health maintenance recommendations are listed below. This is information to help you be sure you are on track for optimal health and monitoring.   Please look over this and let us know if you have any questions or if you have completed any of the health maintenance outside of [facility] so that we can be sure your records are up to date.  ___________________________________________________________  MyChart:  For all urgent or time sensitive needs we ask that you please call the office to avoid delays. Our number is [phone]. MyChart is not constantly monitored and due to the large volume of messages a day, replies may take up to 72 business hours.  MyChart Policy: MyChart allows for you to see your visit notes, after visit summary, provider recommendations, lab and tests results, make an appointment, request refills, and contact your provider or the office for non-urgent questions or concerns. Providers are seeing patients during normal business hours and do not have built in time to review MyChart messages.  We ask that you allow a minimum of 3 business days for responses to MyChart messages. For this reason, please do not send urgent requests through MyChart. Please call the office at [phone]. New and ongoing conditions may require a visit. We have virtual and in-person visits available for your convenience.  Complex MyChart concerns may require a visit. Your provider may request you schedule a virtual or in-person visit to ensure we are providing the best care possible. MyChart messages sent after 11:00 AM on Friday will not be received by the provider until Monday morning.    Lab and Test Results: You will receive your lab and  test results on MyChart as soon as they are completed and results have been sent by the lab or testing facility. Due to this service, you will receive your results BEFORE your provider.  I review lab and test results each morning prior to seeing patients. Some results require collaboration with other providers to ensure you are receiving the most appropriate care. For this reason, we ask that you please allow a minimum of 3-5 business days from the time that ALL results have been received for your provider to receive and review lab and test results and contact you about these.  Most lab and test result comments from the provider will be sent through MyChart. Your provider may recommend changes to the plan of care, follow-up visits, repeat testing, ask questions, or request an office visit to discuss these results. You may reply directly to this message or call the office to provide information for the provider or set up an appointment. In some instances, you will be called with test results and recommendations. Please let us know if this is preferred and we will make note of this in your chart to provide this for you.    If you have not heard a response to your lab or test results in 5 business days from all results returning to MyChart, please call the office to let us know. We ask that you please avoid calling prior to this time unless there is an emergent concern. Due to high call volumes, this can delay the resulting process.  After Hours: For all non-emergency after hours needs,   please call the office at [phone] and select the option to reach the on-call  service. On-call services are shared between multiple [facility] offices and therefore it will not be possible to speak directly with your provider. On-call providers may provide medical advice and recommendations, but are unable to provide refills for maintenance medications.  For all emergency or urgent medical needs after normal business  hours, we recommend that you seek care at the closest Urgent Care or Emergency Department to ensure appropriate treatment in a timely manner.  [facility] has a 24 hour emergency room located on the ground floor for your convenience.   Urgent Concerns During the Business Day Providers are seeing patients from 8AM to 5PM with a busy schedule and are most often not able to respond to non-urgent calls until the end of the day or the next business day. If you should have URGENT concerns during the day, please call and speak to the nurse or schedule a same day appointment so that we can address your concern without delay.   Thank you, again, for choosing me as your health care partner. I appreciate your trust and look forward to learning more about you.   Jamielynn Wigley B. Kayleb Warshaw, DNP, FNP-C  ___________________________________________________________  Health Maintenance Recommendations Screening Testing Mammogram Every 1-2 years based on history and risk factors Starting at age 50 Pap Smear Ages 21-39 every 3 years Ages 30-65 every 5 years with HPV testing More frequent testing may be required based on results and history Colon Cancer Screening Every 1-10 years based on test performed, risk factors, and history Starting at age 45 Bone Density Screening Every 2-10 years based on history Starting at age 65 for women Recommendations for men differ based on medication usage, history, and risk factors AAA Screening One time ultrasound Men 65-75 years old who have ever smoked Lung Cancer Screening Low Dose Lung CT every 12 months Age 50-80 years with a 20 pack-year smoking history who still smoke or who have quit within the last 15 years  Screening Labs Routine  Labs: Complete Blood Count (CBC), Complete Metabolic Panel (CMP), Cholesterol (Lipid Panel) Every 6-12 months based on history and medications May be recommended more frequently based on current conditions or  previous results Hemoglobin A1c Lab Every 3-12 months based on history and previous results Starting at age 45 or earlier with diagnosis of diabetes, high cholesterol, BMI >26, and/or risk factors Frequent monitoring for patients with diabetes to ensure blood sugar control Thyroid Panel (TSH w/ T3 & T4) Every 6 months based on history, symptoms, and risk factors May be repeated more often if on medication HIV One time testing for all patients 13 and older May be repeated more frequently for patients with increased risk factors or exposure Hepatitis C One time testing for all patients 18 and older May be repeated more frequently for patients with increased risk factors or exposure Gonorrhea, Chlamydia Every 12 months for all sexually active persons 13-24 years Additional monitoring may be recommended for those who are considered high risk or who have symptoms PSA Men 40-54 years old with risk factors Additional screening may be recommended from age 55-69 based on risk factors, symptoms, and history  Vaccine Recommendations Tetanus Booster All adults every 10 years Flu Vaccine All patients 6 months and older every year COVID Vaccine All patients 12 years and older Initial dosing with booster May recommend additional booster based on age and health history HPV Vaccine 2 doses all patients   age 9-26 Dosing may be considered for patients over 26 Shingles Vaccine (Shingrix) 2 doses all adults 50 years and older Pneumonia (Pneumovax 23) All adults 65 years and older May recommend earlier dosing based on health history Pneumonia (Prevnar 13) All adults 65 years and older Dosed 1 year after Pneumovax 23 Pneumonia (Prevnar 20) All adults 65 years and older (adults 19-64 with certain conditions or risk factors) 1 dose  For those who have no received Prevnar 13 vaccine previously   Additional Screening, Testing, and Vaccinations may be recommended on an individualized basis based on  family history, health history, risk factors, and/or exposure.  __________________________________________________________  Diet Recommendations for All Patients  I recommend that all patients maintain a diet low in saturated fats, carbohydrates, and cholesterol. While this can be challenging at first, it is not impossible and small changes can make big differences.  Things to try: Decreasing the amount of soda, sweet tea, and/or juice to one or less per day and replace with water While water is always the first choice, if you do not like water you may consider adding a water additive without sugar to improve the taste other sugar free drinks Replace potatoes with a brightly colored vegetable at dinner Use healthy oils, such as canola oil or olive oil, instead of butter or hard margarine Limit your bread intake to two pieces or less a day Replace regular pasta with low carb pasta options Bake, broil, or grill foods instead of frying Monitor portion sizes  Eat smaller, more frequent meals throughout the day instead of large meals  An important thing to remember is, if you love foods that are not great for your health, you don't have to give them up completely. Instead, allow these foods to be a reward when you have done well. Allowing yourself to still have special treats every once in a while is a nice way to tell yourself thank you for working hard to keep yourself healthy.   Also remember that every day is a new day. If you have a bad day and "fall off the wagon", you can still climb right back up and keep moving along on your journey!  We have resources available to help you!  Some websites that may be helpful include: www.MyPlate.gov  Www.VeryWellFit.com _____________________________________________________________  Activity Recommendations for All Patients  I recommend that all adults get at least 20 minutes of moderate physical activity that elevates your heart rate at least 5  days out of the week.  Some examples include: Walking or jogging at a pace that allows you to carry on a conversation Cycling (stationary bike or outdoors) Water aerobics Yoga Weight lifting Dancing If physical limitations prevent you from putting stress on your joints, exercise in a pool or seated in a chair are excellent options.  Do determine your MAXIMUM heart rate for activity: 220 - YOUR AGE = MAX Heart Rate   Remember! Do not push yourself too hard.  Start slowly and build up your pace, speed, weight, time in exercise, etc.  Allow your body to rest between exercise and get good sleep. You will need more water than normal when you are exerting yourself. Do not wait until you are thirsty to drink. Drink with a purpose of getting in at least 8, 8 ounce glasses of water a day plus more depending on how much you exercise and sweat.    If you begin to develop dizziness, chest pain, abdominal pain, jaw pain, shortness of breath, headache,   vision changes, lightheadedness, or other concerning symptoms, stop the activity and allow your body to rest. If your symptoms are severe, seek emergency evaluation immediately. If your symptoms are concerning, but not severe, please let us know so that we can recommend further evaluation.     

## 2021-11-08 NOTE — Progress Notes (Signed)
New Patient Office Visit  Subjective    Patient ID: Corey [name], male    DOB: [birth year]  Age: 51 y.o. MRN: [id number]  CC: establish care, weight loss, dry cough   HPI Corey [name] presents to establish care. He was previously with the [place] office before they closed and then did one physical with [facility], but wanted to come back to [facility].    He does not have any chronic/daily medications other than vitamins/supplements. Relatively healthy overall. He is agreeable to Cologuard for screening. Reports he has been following with dermatology for a basal cell lesion to right forehead. He would like to discuss weight loss and dry cough.    Weight loss: Patient reports his wife has noticed that he is looking thinner recently. He reports losing about 10 pounds in the past few months without trying. States he eats a relatively healthy diet, 3 meals per day. He does not get any regular exercise, but does report doing a lot of yard work this spring. He denies any unusual fatigue, weakness, skin changes.    Dry cough: Patient reports he has had a dry cough for the past 2 months or so. He does feel like it may have started around the time he was doing a lot of yard work, but denies any history of allergies. States he will occasionally get a brief sore throat as well. He has been trying to stay inside more the past few weeks and he does think that may be helping the cough frequency. He denies any congestion, wheezing, dyspnea, sinus pressure, headaches, purulent sputum, hemoptysis, daytime sleepiness, regular snoring, observed apnea, chest pain. Reports he did smoke a decent amount of marijuana and about 5 cigarettes per day for a 10-12 year period in his teens/20's.     Outpatient Encounter Medications as of 11/08/2021  Medication Sig   Ascorbic Acid (VITAMIN C PO) Take by mouth.   Cholecalciferol (VITAMIN D3 PO) Take by mouth.   GLUCOSAMINE HCL PO Take by mouth.   MAGNESIUM CITRATE PO Take by  mouth.   Omega-3 Fatty Acids (FISH OIL PO) Take by mouth.   Turmeric (CURCUMIN 95 PO) Take by mouth.   Multiple Vitamin (MULTIVITAMIN) tablet Take 1 tablet by mouth daily.   No facility-administered encounter medications on file as of 11/08/2021.    Past Medical History:  Diagnosis Date   CAD (coronary artery disease)    Congenital hearing loss    B hearing aides    Past Surgical History:  Procedure Laterality Date   ARTHROSCOPIC REPAIR ACL     SHOULDER SURGERY      Family History  Problem Relation Age of Onset   COPD Mother    Cancer Mother 12       lung cancer   Hyperlipidemia Mother    Hypertension Mother    Transient ischemic attack Mother    Early death Father    Alcohol abuse Father    Arthritis Brother     Social History   Socioeconomic History   Marital status: Married    Spouse name: Corey [name]   Number of children: 0   Years of education: Not on file   Highest education level: Not on file  Occupational History   Occupation: Clinical biochemist  Tobacco Use   Smoking status: Former   Smokeless tobacco: Never  Building services engineer Use: Never used  Substance and Sexual Activity   Alcohol use: No   Drug use: No   Sexual activity:  Yes    Partners: Female  Other Topics Concern   Not on file  Social History Narrative   Marital status: married since 1998; moved from [place] in 2017      Children: 2 stepchildren (30, 2); 1 grandchild      Lives: with wife, son.      Employment:  Clinical biochemist      Tobacco:  Never      Alcohol: none      Drugs: none; marijuana/crack/cocaine/PCP/heroine; previous iv drug use as teenager; age [age].      Exercise:  Riding bike five days per week; 30 minute bike ride at lunch each day      Seatbelt: 100%; no texting         Social Determinants of Corporate investment banker Strain: Not on file  Food Insecurity: Not on file  Transportation Needs: Not on file  Physical Activity: Not on file  Stress: Not on file   Social Connections: Not on file  Intimate Partner Violence: Not on file    ROS All review of systems negative except what is listed in the HPI      Objective    BP 104/68   Pulse 77   Ht 5\' 9"  (1.753 m)   Wt 183 lb (83 kg)   BMI 27.02 kg/m   Physical Exam Vitals reviewed.  Constitutional:      Appearance: Normal appearance.  Cardiovascular:     Rate and Rhythm: Normal rate and regular rhythm.  Pulmonary:     Effort: Pulmonary effort is normal.     Breath sounds: Normal breath sounds. No wheezing, rhonchi or rales.  Skin:    General: Skin is warm and dry.     Comments: Erythematous lesion to right forehead where previous BCC removed/treated  Neurological:     General: No focal deficit present.     Mental Status: He is alert and oriented to person, place, and time. Mental status is at baseline.  Psychiatric:        Mood and Affect: Mood normal.        Behavior: Behavior normal.        Thought Content: Thought content normal.        Judgment: Judgment normal.             Assessment & Plan:   1. Subacute cough Given duration will get CXR. Smoking history (~5 cigarretes per day for 10-12 years ending before age 56). Possibly associated with allergies as recent yard work, being outdoors, etc tends to be a trigger. If CXR negative, can try further treatment for allergic causes.  - DG Chest 2 View; Future  2. Weight loss Patient reports losing about 10 pounds over the past 3-6 months without trying. No other symptoms other than dry cough mentioned above. Updating labs today.  - CBC - Comprehensive metabolic panel - TSH  3. Colon cancer screening - Cologuard    Return for - pending results; physical in the fall .   26, NP

## 2021-11-17 ENCOUNTER — Encounter: Payer: Self-pay | Admitting: Family Medicine

## 2021-11-17 DIAGNOSIS — R052 Subacute cough: Secondary | ICD-10-CM

## 2021-11-18 LAB — COLOGUARD: COLOGUARD: NEGATIVE

## 2021-12-06 NOTE — Progress Notes (Signed)
Synopsis: Referred for cough and weight loss by Clayborne Dana, NP  Subjective:   PATIENT ID: Corey [name] Number GENDER: male DOB: Sep 27, 1970, MRN: [id number]  Chief Complaint  Patient presents with   Pulmonary Consult    Self referral. Pt had cough for 2-3 months- stopped 2 wks ago after started taking allergy med otc. He states he has lost 10 lbs unintentionally over the past several months.    51y Mwith history of asthma, CAD, congenital hearing loss, former smoker referred for cough and weight loss.   He says that he's had a cough for 2.5 months. Has also lost 10 lb unintentionally. He wonders if this cough is related to his yardwork. Dry cough. No trouble breathing. Maybe some pharyngitis/laryngitis. He did try an antihistamine or a decongestant and it's pretty much gone. No fever. No CP. No drenching night sweats. He has sinus congestion that comes and goes. He hasn't really had any PND over the last couple months. No reflux/heartburn.   Otherwise pertinent review of systems is negative.  His mother had lung cancer  He works in Clinical biochemist. Smoked for 13 years, 1ppd, lots of MJ, some angel dust, crack cocaine - frequent use. Daughter moved in a couple months ago with a dog. He also lived in a building where he needed to use diatomaceous earth frequently for bed bugs.   Past Medical History:  Diagnosis Date   CAD (coronary artery disease)    Congenital hearing loss    B hearing aides     Family History  Problem Relation Age of Onset   COPD Mother    Cancer Mother 63       lung cancer   Hyperlipidemia Mother    Hypertension Mother    Transient ischemic attack Mother    Early death Father    Alcohol abuse Father    Arthritis Brother      Past Surgical History:  Procedure Laterality Date   ARTHROSCOPIC REPAIR ACL     SHOULDER SURGERY      Social History   Socioeconomic History   Marital status: Married    Spouse name: Harriett Sine   Number of children: 0   Years of  education: Not on file   Highest education level: Not on file  Occupational History   Occupation: Clinical biochemist  Tobacco Use   Smoking status: Former    Packs/day: 1.00    Years: 13.00    Total pack years: 13.00    Types: Cigarettes    Quit date: 05/21/1998    Years since quitting: 23.5   Smokeless tobacco: Never  Vaping Use   Vaping Use: Never used  Substance and Sexual Activity   Alcohol use: No   Drug use: No   Sexual activity: Yes    Partners: Female  Other Topics Concern   Not on file  Social History Narrative   Marital status: married since 1998; moved from [place] in 2017      Children: 2 stepchildren (30, 31); 1 grandchild      Lives: with wife, son.      Employment:  Clinical biochemist      Tobacco:  Never      Alcohol: none      Drugs: none; marijuana/crack/cocaine/PCP/heroine; previous iv drug use as teenager; age [age].      Exercise:  Riding bike five days per week; 30 minute bike ride at lunch each day      Seatbelt: 100%; no texting  Social Determinants of Health   Financial Resource Strain: Not on file  Food Insecurity: Not on file  Transportation Needs: Not on file  Physical Activity: Not on file  Stress: Not on file  Social Connections: Not on file  Intimate Partner Violence: Not on file     No Known Allergies   Outpatient Medications Prior to Visit  Medication Sig Dispense Refill   Ascorbic Acid (VITAMIN C PO) Take 1 tablet by mouth daily.     Cholecalciferol (VITAMIN D3 PO) Take 1 tablet by mouth daily.     GLUCOSAMINE HCL PO Take 1 tablet by mouth daily.     MAGNESIUM CITRATE PO Take by mouth.     Multiple Vitamin (MULTIVITAMIN) tablet Take 1 tablet by mouth daily.     Omega-3 Fatty Acids (FISH OIL PO) Take 1 capsule by mouth daily.     Turmeric (CURCUMIN 95 PO) Take 1 tablet by mouth daily.     No facility-administered medications prior to visit.       Objective:   Physical Exam:  General appearance: 51 y.o., male, NAD,  conversant  Eyes: anicteric sclerae; PERRL, tracking appropriately HENT: NCAT; MMM Neck: Trachea midline; no lymphadenopathy, no JVD Lungs: CTAB, no crackles, no wheeze, with normal respiratory effort CV: RRR, no murmur  Abdomen: Soft, non-tender; non-distended, BS present  Extremities: No peripheral edema, warm Skin: Normal turgor and texture; no rash Psych: Appropriate affect Neuro: Alert and oriented to person and place, no focal deficit     Vitals:   12/07/21 0914  BP: 112/68  Pulse: 82  Temp: 98.1 F (36.7 C)  TempSrc: Oral  SpO2: 95%  Weight: 183 lb (83 kg)  Height: 5\' 9"  (1.753 m)   95% on RA BMI Readings from Last 3 Encounters:  12/07/21 27.02 kg/m  11/08/21 27.02 kg/m  06/09/21 27.85 kg/m   Wt Readings from Last 3 Encounters:  12/07/21 183 lb (83 kg)  11/08/21 183 lb (83 kg)  06/09/21 188 lb 9.6 oz (85.5 kg)     CBC    Component Value Date/Time   WBC 6.7 11/08/2021 0921   RBC 4.84 11/08/2021 0921   HGB 14.4 11/08/2021 0921   HGB 14.6 03/07/2020 1807   HCT 44.6 11/08/2021 0921   HCT 43.0 03/07/2020 1807   PLT 230.0 11/08/2021 0921   PLT 212 03/07/2020 1807   MCV 92.0 11/08/2021 0921   MCV 91 03/07/2020 1807   MCH 30.9 03/07/2020 1807   MCH 30.0 01/14/2016 1449   MCHC 32.4 11/08/2021 0921   RDW 13.5 11/08/2021 0921   RDW 12.3 03/07/2020 1807   LYMPHSABS 1,575 01/14/2016 1449   MONOABS 675 01/14/2016 1449   EOSABS 75 01/14/2016 1449   BASOSABS 0 01/14/2016 1449    Chest Imaging: CXR 11/08/21 reviewed by me unremarkable   Pulmonary Functions Testing Results:     No data to display           Echocardiogram:   TTE 04/2020:  1. Left ventricular ejection fraction, by estimation, is 60 to 65%. The  left ventricle has normal function. The left ventricle has no regional  wall motion abnormalities.   2. Left ventricular diastolic parameters were normal.   3. Right ventricular systolic function is normal. The right ventricular  size is  normal.   4. The mitral valve is normal in structure. Trivial mitral valve  regurgitation.   5. The aortic valve is tricuspid. Aortic valve regurgitation is trivial.  No aortic stenosis is present.  6. The inferior vena cava is normal in size with greater than 50%  respiratory variability, suggesting right atrial pressure of 3 mmHg.       Assessment & Plan:   # Chronic cough Pretty much resolved but he worries about past exposures and possible ILD, smoking and lung cancer.   Plan: - pt desires low dose ct chest to minimize radiation exposure though I discussed with him that it is not ideal study for ILD and that with normal CXR and distant smoking history and him being relatively young that it would be low yield for lung cancer - full PFT   RTC 4 weeks to go over results  Omar Person, MD [facility] 12/07/2021 9:24 AM

## 2021-12-07 ENCOUNTER — Ambulatory Visit: Payer: Managed Care, Other (non HMO) | Admitting: Student

## 2021-12-07 ENCOUNTER — Encounter: Payer: Self-pay | Admitting: Student

## 2021-12-07 VITALS — BP 112/68 | HR 82 | Temp 98.1°F | Ht 69.0 in | Wt 183.0 lb

## 2021-12-07 DIAGNOSIS — R053 Chronic cough: Secondary | ICD-10-CM | POA: Diagnosis not present

## 2021-12-07 NOTE — Patient Instructions (Addendum)
-   See you in a month to go over results of CT Chest and PFTs

## 2021-12-11 ENCOUNTER — Other Ambulatory Visit: Payer: Self-pay | Admitting: Student

## 2021-12-12 ENCOUNTER — Encounter: Payer: Self-pay | Admitting: Student

## 2021-12-12 DIAGNOSIS — R053 Chronic cough: Secondary | ICD-10-CM

## 2021-12-12 NOTE — Telephone Encounter (Signed)
My chart message sent to ask if he wants [place] to order regular ct chest rather than low dose ct chest.

## 2021-12-15 NOTE — Telephone Encounter (Signed)
Ct without ordered

## 2021-12-18 NOTE — Telephone Encounter (Signed)
I have scheduled CT and gave appt info to pt.  Will route back to triage to close MyChart message.

## 2021-12-26 ENCOUNTER — Ambulatory Visit
Admission: RE | Admit: 2021-12-26 | Discharge: 2021-12-26 | Disposition: A | Discharge status: Home / Self Care | Payer: Managed Care, Other (non HMO) | Source: Ambulatory Visit | Attending: Student

## 2021-12-26 DIAGNOSIS — R053 Chronic cough: Secondary | ICD-10-CM

## 2021-12-27 ENCOUNTER — Ambulatory Visit (HOSPITAL_BASED_OUTPATIENT_CLINIC_OR_DEPARTMENT_OTHER): Payer: Managed Care, Other (non HMO)

## 2021-12-27 ENCOUNTER — Encounter: Payer: Self-pay | Admitting: Family Medicine

## 2021-12-27 ENCOUNTER — Ambulatory Visit: Payer: Managed Care, Other (non HMO) | Admitting: Family Medicine

## 2021-12-27 VITALS — BP 103/60 | HR 78 | Ht 69.0 in | Wt 183.8 lb

## 2021-12-27 DIAGNOSIS — M25541 Pain in joints of right hand: Secondary | ICD-10-CM | POA: Diagnosis not present

## 2021-12-27 DIAGNOSIS — M25542 Pain in joints of left hand: Secondary | ICD-10-CM | POA: Diagnosis not present

## 2021-12-27 LAB — SEDIMENTATION RATE: Sed Rate: 24 mm/hr — ABNORMAL HIGH (ref 0–20)

## 2021-12-27 NOTE — Patient Instructions (Signed)
Starting with hand xrays and blood work to check for signs of inflammatory process.

## 2021-12-27 NOTE — Progress Notes (Signed)
   Acute Office Visit  Subjective:     Patient ID: Corey [name], male    DOB: 1971/01/14, 51 y.o.   MRN: [id number]  CC: joint pain   HPI Patient is in today for joint pain.   Patient reporting ongoing pain, stiffness, inflammation to left index finger and right 3rd finger MCP/PIP that has been more bothersome for the past several weeks. Reports that left index finger was worked up for RA in the past and it was negative. He did receive joint injection which helped temporarily. He recently tried topical treatment for skin cancer to forehead and is wondering if it has triggered an autoimmune disease causing his finger joints to become more bothersome lately. He mentioned this to dermatologist when he saw them yesterday and they have already placed a rheumatology referral. He reports the symptoms tend to be worse first thing in the morning and during the night. Stiffness, pain, inflammation will ease up during the day while he is active. He denies any erythema or warmth at the joints.      ROS All review of systems negative except what is listed in the HPI      Objective:    BP 103/60   Pulse 78   Ht 5\' 9"  (1.753 m)   Wt 183 lb 12.8 oz (83.4 kg)   BMI 27.14 kg/m    Physical Exam Vitals reviewed.  Constitutional:      Appearance: Normal appearance.  Musculoskeletal:        General: No tenderness.     Comments: Left 2nd finger and right 3rd finger MCP/PIP inflammation, stiffness (decreased ROM), no tenderness to palpation, erythema, or warmth  Skin:    Findings: No erythema.  Neurological:     General: No focal deficit present.     Mental Status: He is alert and oriented to person, place, and time. Mental status is at baseline.  Psychiatric:        Mood and Affect: Mood normal.        Behavior: Behavior normal.        Thought Content: Thought content normal.        Judgment: Judgment normal.     No results found for any visits on 12/27/21.      Assessment & Plan:    Problem List Items Addressed This Visit   None Visit Diagnoses     Arthralgia of both hands    -  Primary Starting with imaging and labs. Pending results and referral (placed to rheum yesterday to [facility] by his dermatologist) we can change/update referral. Patient agreeable to plan.     Relevant Orders   DG Hand Complete Left   DG Hand Complete Right   Sedimentation rate   Rheumatoid Factor       No orders of the defined types were placed in this encounter.   Return if symptoms worsen or fail to improve, for (pending results).  02/26/22, NP

## 2021-12-29 LAB — RHEUMATOID FACTOR: Rheumatoid fact SerPl-aCnc: <14 IU/mL (ref <14)

## 2022-01-05 ENCOUNTER — Ambulatory Visit (HOSPITAL_BASED_OUTPATIENT_CLINIC_OR_DEPARTMENT_OTHER)
Admission: RE | Admit: 2022-01-05 | Discharge: 2022-01-05 | Disposition: A | Discharge status: Home / Self Care | Payer: Managed Care, Other (non HMO) | Source: Ambulatory Visit | Attending: Family Medicine | Admitting: Family Medicine

## 2022-01-05 DIAGNOSIS — M25542 Pain in joints of left hand: Secondary | ICD-10-CM | POA: Insufficient documentation

## 2022-01-05 DIAGNOSIS — M25541 Pain in joints of right hand: Secondary | ICD-10-CM | POA: Insufficient documentation

## 2022-01-10 ENCOUNTER — Encounter: Payer: Self-pay | Admitting: Family Medicine

## 2022-01-23 ENCOUNTER — Other Ambulatory Visit: Payer: Self-pay | Admitting: *Deleted

## 2022-01-23 DIAGNOSIS — L4050 Arthropathic psoriasis, unspecified: Secondary | ICD-10-CM

## 2022-01-29 NOTE — Progress Notes (Unsigned)
Synopsis: Referred for cough and weight loss by Clayborne Dana, NP  Subjective:   PATIENT ID: Raelyn Number GENDER: male DOB: Jan 13, 1971, MRN: [id number]  No chief complaint on file.  51y Mwith history of asthma, CAD, congenital hearing loss, former smoker referred for cough and weight loss.   He says that he's had a cough for 2.5 months. Has also lost 10 lb unintentionally. He wonders if this cough is related to his yardwork. Dry cough. No trouble breathing. Maybe some pharyngitis/laryngitis. He did try an antihistamine or a decongestant and it's pretty much gone. No fever. No CP. No drenching night sweats. He has sinus congestion that comes and goes. He hasn't really had any PND over the last couple months. No reflux/heartburn.   His mother had lung cancer  He works in Clinical biochemist. Smoked for 13 years, 1ppd, lots of MJ, some angel dust, crack cocaine - frequent use. Daughter moved in a couple months ago with a dog. He also lived in a building where he needed to use diatomaceous earth frequently for bed bugs.   Interval HPI: Unremarkable CT Chest 12/27/21  PFTs  Otherwise pertinent review of systems is negative.   Past Medical History:  Diagnosis Date   CAD (coronary artery disease)    Congenital hearing loss    B hearing aides     Family History  Problem Relation Age of Onset   COPD Mother    Cancer Mother 17       lung cancer   Hyperlipidemia Mother    Hypertension Mother    Transient ischemic attack Mother    Early death Father    Alcohol abuse Father    Arthritis Brother      Past Surgical History:  Procedure Laterality Date   ARTHROSCOPIC REPAIR ACL     SHOULDER SURGERY      Social History   Socioeconomic History   Marital status: Married    Spouse name: Harriett Sine   Number of children: 0   Years of education: Not on file   Highest education level: Not on file  Occupational History   Occupation: Clinical biochemist  Tobacco Use   Smoking status:  Former    Packs/day: 1.00    Years: 13.00    Total pack years: 13.00    Types: Cigarettes    Quit date: 05/21/1998    Years since quitting: 23.7   Smokeless tobacco: Never  Vaping Use   Vaping Use: Never used  Substance and Sexual Activity   Alcohol use: No   Drug use: No   Sexual activity: Yes    Partners: Female  Other Topics Concern   Not on file  Social History Narrative   Marital status: married since 1998; moved from [place] in 2017      Children: 2 stepchildren (30, 62); 1 grandchild      Lives: with wife, son.      Employment:  Clinical biochemist      Tobacco:  Never      Alcohol: none      Drugs: none; marijuana/crack/cocaine/PCP/heroine; previous iv drug use as teenager; age [age].      Exercise:  Riding bike five days per week; 30 minute bike ride at lunch each day      Seatbelt: 100%; no texting         Social Determinants of Health   Financial Resource Strain: Not on file  Food Insecurity: Not on file  Transportation Needs: Not on file  Physical Activity: Not on file  Stress: Not on file  Social Connections: Not on file  Intimate Partner Violence: Not on file     No Known Allergies   Outpatient Medications Prior to Visit  Medication Sig Dispense Refill   Ascorbic Acid (VITAMIN C PO) Take 1 tablet by mouth daily.     Cholecalciferol (VITAMIN D3 PO) Take 1 tablet by mouth daily.     GLUCOSAMINE HCL PO Take 1 tablet by mouth daily.     MAGNESIUM CITRATE PO Take by mouth.     Multiple Vitamin (MULTIVITAMIN) tablet Take 1 tablet by mouth daily.     Omega-3 Fatty Acids (FISH OIL PO) Take 1 capsule by mouth daily.     Turmeric (CURCUMIN 95 PO) Take 1 tablet by mouth daily.     No facility-administered medications prior to visit.       Objective:   Physical Exam:  General appearance: 51 y.o., male, NAD, conversant  Eyes: anicteric sclerae; PERRL, tracking appropriately HENT: NCAT; MMM Neck: Trachea midline; no lymphadenopathy, no JVD Lungs: CTAB, no  crackles, no wheeze, with normal respiratory effort CV: RRR, no murmur  Abdomen: Soft, non-tender; non-distended, BS present  Extremities: No peripheral edema, warm Skin: Normal turgor and texture; no rash Psych: Appropriate affect Neuro: Alert and oriented to person and place, no focal deficit     There were no vitals filed for this visit.    on RA BMI Readings from Last 3 Encounters:  12/27/21 27.14 kg/m  12/07/21 27.02 kg/m  11/08/21 27.02 kg/m   Wt Readings from Last 3 Encounters:  12/27/21 183 lb 12.8 oz (83.4 kg)  12/07/21 183 lb (83 kg)  11/08/21 183 lb (83 kg)     CBC    Component Value Date/Time   WBC 6.7 11/08/2021 0921   RBC 4.84 11/08/2021 0921   HGB 14.4 11/08/2021 0921   HGB 14.6 03/07/2020 1807   HCT 44.6 11/08/2021 0921   HCT 43.0 03/07/2020 1807   PLT 230.0 11/08/2021 0921   PLT 212 03/07/2020 1807   MCV 92.0 11/08/2021 0921   MCV 91 03/07/2020 1807   MCH 30.9 03/07/2020 1807   MCH 30.0 01/14/2016 1449   MCHC 32.4 11/08/2021 0921   RDW 13.5 11/08/2021 0921   RDW 12.3 03/07/2020 1807   LYMPHSABS 1,575 01/14/2016 1449   MONOABS 675 01/14/2016 1449   EOSABS 75 01/14/2016 1449   BASOSABS 0 01/14/2016 1449    Chest Imaging: CXR 11/08/21 reviewed by me unremarkable  CT Chest 12/27/21 reviewed by me unremarkable   Pulmonary Functions Testing Results:     No data to display           Echocardiogram:   TTE 04/2020:  1. Left ventricular ejection fraction, by estimation, is 60 to 65%. The  left ventricle has normal function. The left ventricle has no regional  wall motion abnormalities.   2. Left ventricular diastolic parameters were normal.   3. Right ventricular systolic function is normal. The right ventricular  size is normal.   4. The mitral valve is normal in structure. Trivial mitral valve  regurgitation.   5. The aortic valve is tricuspid. Aortic valve regurgitation is trivial.  No aortic stenosis is present.   6. The inferior  vena cava is normal in size with greater than 50%  respiratory variability, suggesting right atrial pressure of 3 mmHg.       Assessment & Plan:   # Chronic cough Pretty much resolved but he  worries about past exposures and possible ILD, smoking and lung cancer.   Plan: - pt desires low dose ct chest to minimize radiation exposure though I discussed with him that it is not ideal study for ILD and that with normal CXR and distant smoking history and him being relatively young that it would be low yield for lung cancer - full PFT   RTC 4 weeks to go over results  Omar Person, MD [facility] 01/29/2022 3:40 PM

## 2022-01-31 ENCOUNTER — Ambulatory Visit: Payer: Managed Care, Other (non HMO) | Admitting: Student

## 2022-01-31 ENCOUNTER — Encounter: Payer: Self-pay | Admitting: Student

## 2022-01-31 VITALS — BP 120/76 | HR 84 | Ht 69.0 in | Wt 179.0 lb

## 2022-01-31 DIAGNOSIS — R053 Chronic cough: Secondary | ICD-10-CM | POA: Diagnosis not present

## 2022-01-31 LAB — PULMONARY FUNCTION TEST
DL/VA % pred: 130 %
DL/VA: 5.74 ml/min/mmHg/L
DLCO cor % pred: 134 %
DLCO cor: 37.5 ml/min/mmHg
DLCO unc % pred: 134 %
DLCO unc: 37.5 ml/min/mmHg
FEF 25-75 Post: 4.94 L/sec
FEF 25-75 Pre: 5.24 L/sec
FEF2575-%Change-Post: -5 %
FEF2575-%Pred-Post: 152 %
FEF2575-%Pred-Pre: 162 %
FEV1-%Change-Post: -1 %
FEV1-%Pred-Post: 112 %
FEV1-%Pred-Pre: 114 %
FEV1-Post: 4.19 L
FEV1-Pre: 4.24 L
FEV1FVC-%Change-Post: 3 %
FEV1FVC-%Pred-Pre: 108 %
FEV6-%Change-Post: -3 %
FEV6-%Pred-Post: 105 %
FEV6-%Pred-Pre: 109 %
FEV6-Post: 4.88 L
FEV6-Pre: 5.06 L
FEV6FVC-%Change-Post: 0 %
FEV6FVC-%Pred-Post: 103 %
FEV6FVC-%Pred-Pre: 103 %
FVC-%Change-Post: -3 %
FVC-%Pred-Post: 101 %
FVC-%Pred-Pre: 105 %
FVC-Post: 4.88 L
FVC-Pre: 5.08 L
Post FEV1/FVC ratio: 86 %
Post FEV6/FVC ratio: 100 %
Pre FEV1/FVC ratio: 83 %
Pre FEV6/FVC Ratio: 100 %
RV % pred: 120 %
RV: 2.47 L
TLC % pred: 110 %
TLC: 7.45 L

## 2022-01-31 NOTE — Patient Instructions (Signed)
Full PFT performed. 

## 2022-01-31 NOTE — Patient Instructions (Addendum)
-   If you find that you're having frequent trouble breathing, cough around molds, seasonal allergens, etc let us know and we can consider more sensitive testing for asthma like methacholine challenge or test for allergy, etc

## 2022-01-31 NOTE — Progress Notes (Signed)
Full PFT performed today. °

## 2022-02-02 ENCOUNTER — Ambulatory Visit: Payer: Self-pay | Admitting: Podiatry

## 2022-07-05 ENCOUNTER — Encounter: Payer: Self-pay | Admitting: Cardiovascular Disease

## 2022-07-05 ENCOUNTER — Ambulatory Visit: Payer: Managed Care, Other (non HMO) | Attending: Cardiovascular Disease | Admitting: Cardiovascular Disease

## 2022-07-05 VITALS — BP 108/70 | HR 72 | Ht 67.5 in | Wt 169.6 lb

## 2022-07-05 DIAGNOSIS — I2510 Atherosclerotic heart disease of native coronary artery without angina pectoris: Secondary | ICD-10-CM

## 2022-07-05 DIAGNOSIS — E7800 Pure hypercholesterolemia, unspecified: Secondary | ICD-10-CM | POA: Diagnosis not present

## 2022-07-05 NOTE — Progress Notes (Signed)
Chief Complaint  Patient presents with   Follow-up    CAD   History of Present Illness: 52 yo male with history mild CAD by coronary CTA in 2010 who is here today for follow up. I met him as a new patient in November 2021 for evaluation of chest pain. EKG in primary care 03/07/20 with sinus rhythm, incomplete RBBB. He had a coronary CTA in 2010 in the [place] which showed plaque in the distal LAD. Echo in 2009 in the [place] with normal LV function, trivial AI, trivial MR. At his first visit here he described chest tingling while lying in bed with no associated dyspnea or dizziness.  He has had no exertional chest pain. Echo December 2021 with LVEF=60-65%, no significant valve disease.   He is here today for follow up. The patient denies any chest pain, dyspnea, palpitations, lower extremity edema, orthopnea, PND, dizziness, near syncope or syncope.   Primary Care Physician: Terrilyn Saver, NP  Past Medical History:  Diagnosis Date   CAD (coronary artery disease)    Congenital hearing loss    B hearing aides    Past Surgical History:  Procedure Laterality Date   ARTHROSCOPIC REPAIR ACL     SHOULDER SURGERY      Current Outpatient Medications  Medication Sig Dispense Refill   Ascorbic Acid (VITAMIN C PO) Take 1 tablet by mouth daily.     Cholecalciferol (VITAMIN D3 PO) Take 1 tablet by mouth daily. Per patient taking 5,000 units     GLUCOSAMINE HCL PO Take 1 tablet by mouth daily.     Hydroxychloroquine Sulfate 100 MG TABS daily.     MAGNESIUM CITRATE PO Take by mouth daily.     Multiple Vitamin (MULTIVITAMIN) tablet Take 1 tablet by mouth daily.     Omega-3 Fatty Acids (FISH OIL PO) Take 1 capsule by mouth daily.     rifampin (RIFADIN) 300 MG capsule Take by mouth daily. Per patient taking for TB     Turmeric (CURCUMIN 95 PO) Take 1 tablet by mouth daily.     No current facility-administered medications for this visit.    No Known Allergies  Social History   Socioeconomic  History   Marital status: Married    Spouse name: Izora Gala   Number of children: 0   Years of education: Not on file   Highest education level: Not on file  Occupational History   Occupation: Investment banker, corporate  Tobacco Use   Smoking status: Former    Packs/day: 1.00    Years: 13.00    Total pack years: 13.00    Types: Cigarettes    Quit date: 05/21/1998    Years since quitting: 24.1   Smokeless tobacco: Never  Vaping Use   Vaping Use: Never used  Substance and Sexual Activity   Alcohol use: No   Drug use: No   Sexual activity: Yes    Partners: Female  Other Topics Concern   Not on file  Social History Narrative   Marital status: married since 1998; moved from [place] in 2017      Children: 2 stepchildren (30, 63); 1 grandchild      Lives: with wife, son.      Employment:  Investment banker, corporate      Tobacco:  Never      Alcohol: none      Drugs: none; marijuana/crack/cocaine/PCP/heroine; previous iv drug use as teenager; age [age].      Exercise:  Riding bike five days per  week; 30 minute bike ride at lunch each day      Seatbelt: 100%; no texting         Social Determinants of Radio broadcast assistant Strain: Not on file  Food Insecurity: Not on file  Transportation Needs: Not on file  Physical Activity: Not on file  Stress: Not on file  Social Connections: Not on file  Intimate Partner Violence: Not on file    Family History  Problem Relation Age of Onset   COPD Mother    Cancer Mother 59       lung cancer   Hyperlipidemia Mother    Hypertension Mother    Transient ischemic attack Mother    Early death Father    Alcohol abuse Father    Arthritis Brother     Review of Systems:  As stated in the HPI and otherwise negative.   BP 108/70   Pulse 72   Ht 5' 7.5" (1.715 m)   Wt 76.9 kg   SpO2 95%   BMI 26.17 kg/m   Physical Examination:  General: Well developed, well nourished, NAD  HEENT: OP clear, mucus membranes moist  SKIN: warm, dry. No  rashes. Neuro: No focal deficits  Musculoskeletal: Muscle strength 5/5 all ext  Psychiatric: Mood and affect normal  Neck: No JVD, no carotid bruits, no thyromegaly, no lymphadenopathy.  Lungs:Clear bilaterally, no wheezes, rhonci, crackles Cardiovascular: Regular rate and rhythm. No murmurs, gallops or rubs. Abdomen:Soft. Bowel sounds present. Non-tender.  Extremities: No lower extremity edema. Pulses are 2 + in the bilateral DP/PT.  EKG:  EKG is ordered today. The ekg ordered today demonstrates NSR  Echo December 2021:  1. Left ventricular ejection fraction, by estimation, is 60 to 65%. The  left ventricle has normal function. The left ventricle has no regional  wall motion abnormalities.   2. Left ventricular diastolic parameters were normal.   3. Right ventricular systolic function is normal. The right ventricular  size is normal.   4. The mitral valve is normal in structure. Trivial mitral valve  regurgitation.   5. The aortic valve is tricuspid. Aortic valve regurgitation is trivial.  No aortic stenosis is present.   6. The inferior vena cava is normal in size with greater than 50%  respiratory variability, suggesting right atrial pressure of 3 mmHg.   Recent Labs: 11/08/2021: ALT 13; BUN 15; Creatinine, Ser 1.16; Hemoglobin 14.4; Platelets 230.0; Potassium 5.0; Sodium 139; TSH 3.49   Lipid Panel    Component Value Date/Time   CHOL 221 (H) 04/18/2020 1009   TRIG 148 04/18/2020 1009   HDL 47 04/18/2020 1009   CHOLHDL 4.7 04/18/2020 1009   CHOLHDL 3.6 01/14/2016 1449   VLDL 12 01/14/2016 1449   LDLCALC 147 (H) 04/18/2020 1009     Wt Readings from Last 3 Encounters:  07/05/22 76.9 kg  01/31/22 81.2 kg  12/27/21 83.4 kg    Assessment and Plan:   1. CAD/Atypical chest pain: He is known to have mild CAD by coronary CTA in 2010 in [place]. Echo December 2021 with normal LV function. No chest pain. I have suggested that he consider a daily ASA and statin given his CAD. He  would prefer not to take ASA or a statin.   2. Hyperlipidemia: He refuses to try a statin. I have told him that his goal LDL is under 70 given history of CAD.   Labs/ tests ordered today include:   Orders Placed This Encounter  Procedures  EKG 12-Lead   Disposition:   F/U with me in one year.   Signed, Lauree Chandler, MD 07/05/2022 5:00 PM    [address], [place], [place]  [address] Phone: [phone]; Fax: [phone]

## 2022-07-05 NOTE — Patient Instructions (Signed)
Medication Instructions:  No changes *If you need a refill on your cardiac medications before your next appointment, please call your pharmacy*   Lab Work: none If you have labs (blood work) drawn today and your tests are completely normal, you will receive your results only by: MyChart Message (if you have MyChart) OR A paper copy in the mail If you have any lab test that is abnormal or we need to change your treatment, we will call you to review the results.   Testing/Procedures: none   Follow-Up: At [facility], you and your health needs are our priority.  As part of our continuing mission to provide you with exceptional heart care, we have created designated Provider Care Teams.  These Care Teams include your primary Cardiologist (physician) and Advanced Practice Providers (APPs -  Physician Assistants and Nurse Practitioners) who all work together to provide you with the care you need, when you need it.   Your next appointment:   12 month(s)  Provider:   Christopher McAlhany, MD      

## 2022-09-21 ENCOUNTER — Encounter: Payer: Self-pay | Admitting: Family Medicine

## 2022-09-21 ENCOUNTER — Other Ambulatory Visit (HOSPITAL_COMMUNITY)
Admission: RE | Admit: 2022-09-21 | Discharge: 2022-09-21 | Disposition: A | Discharge status: Home / Self Care | Payer: Managed Care, Other (non HMO) | Source: Ambulatory Visit | Attending: Family Medicine | Admitting: Family Medicine

## 2022-09-21 ENCOUNTER — Ambulatory Visit: Payer: Managed Care, Other (non HMO) | Admitting: Family Medicine

## 2022-09-21 VITALS — BP 118/72 | HR 74 | Ht 67.5 in | Wt 174.0 lb

## 2022-09-21 DIAGNOSIS — Z113 Encounter for screening for infections with a predominantly sexual mode of transmission: Secondary | ICD-10-CM | POA: Insufficient documentation

## 2022-09-21 NOTE — Patient Instructions (Signed)
Screening today. We will update you as results come in.

## 2022-09-21 NOTE — Progress Notes (Signed)
   Acute Office Visit  Subjective:     Patient ID: Corey [name], male    DOB: 12-01-1970, 52 y.o.   MRN: [id number]  Chief Complaint  Patient presents with   Follow-up    STD screen    HPI Patient is in today for STD screening.  He is in a new relationship (male) and would like routine STD screening. No history of STD - recently widowed (previously married/monogamous for 31 years). No current symptoms. He would like urine and blood tests today.     ROS All review of systems negative except what is listed in the HPI      Objective:    BP 118/72   Pulse 74   Ht 5' 7.5" (1.715 m)   Wt 174 lb (78.9 kg)   SpO2 97%   BMI 26.85 kg/m    Physical Exam Vitals reviewed.  Constitutional:      Appearance: Normal appearance.  Neurological:     Mental Status: He is alert and oriented to person, place, and time.  Psychiatric:        Mood and Affect: Mood normal.        Behavior: Behavior normal.        Thought Content: Thought content normal.        Judgment: Judgment normal.     No results found for any visits on 09/21/22.      Assessment & Plan:   Problem List Items Addressed This Visit   None Visit Diagnoses     Screen for STD (sexually transmitted disease)    -  Primary No current symptoms or known exposures. Requesting STD testing.    Relevant Orders   Urine cytology ancillary only   HIV Antibody (routine testing w rflx)   HSV(herpes simplex vrs) 1+2 ab-IgG   RPR       No orders of the defined types were placed in this encounter.   Return if symptoms worsen or fail to improve.  Clayborne Dana, NP

## 2022-09-22 LAB — HSV(HERPES SIMPLEX VRS) I + II AB-IGG
HAV 1 IGG,TYPE SPECIFIC AB: 28.1 index — ABNORMAL HIGH
HSV 2 IGG,TYPE SPECIFIC AB: <0.9 index

## 2022-09-22 LAB — RPR: RPR Ser Ql: NONREACTIVE

## 2022-09-22 LAB — HIV ANTIBODY (ROUTINE TESTING W REFLEX): HIV 1&2 Ab, 4th Generation: NONREACTIVE

## 2022-09-24 LAB — URINE CYTOLOGY ANCILLARY ONLY
Chlamydia: NEGATIVE
Comment: NEGATIVE
Comment: NORMAL
Neisseria Gonorrhea: NEGATIVE

## 2022-11-16 IMAGING — DX DG CHEST 2V
2 series · 2 of 2 positions shown · non-contrast
Comparison: January 14, 2016

CLINICAL DATA: Cough for 2 months

EXAM:
CHEST - 2 VIEW

[chest pa]
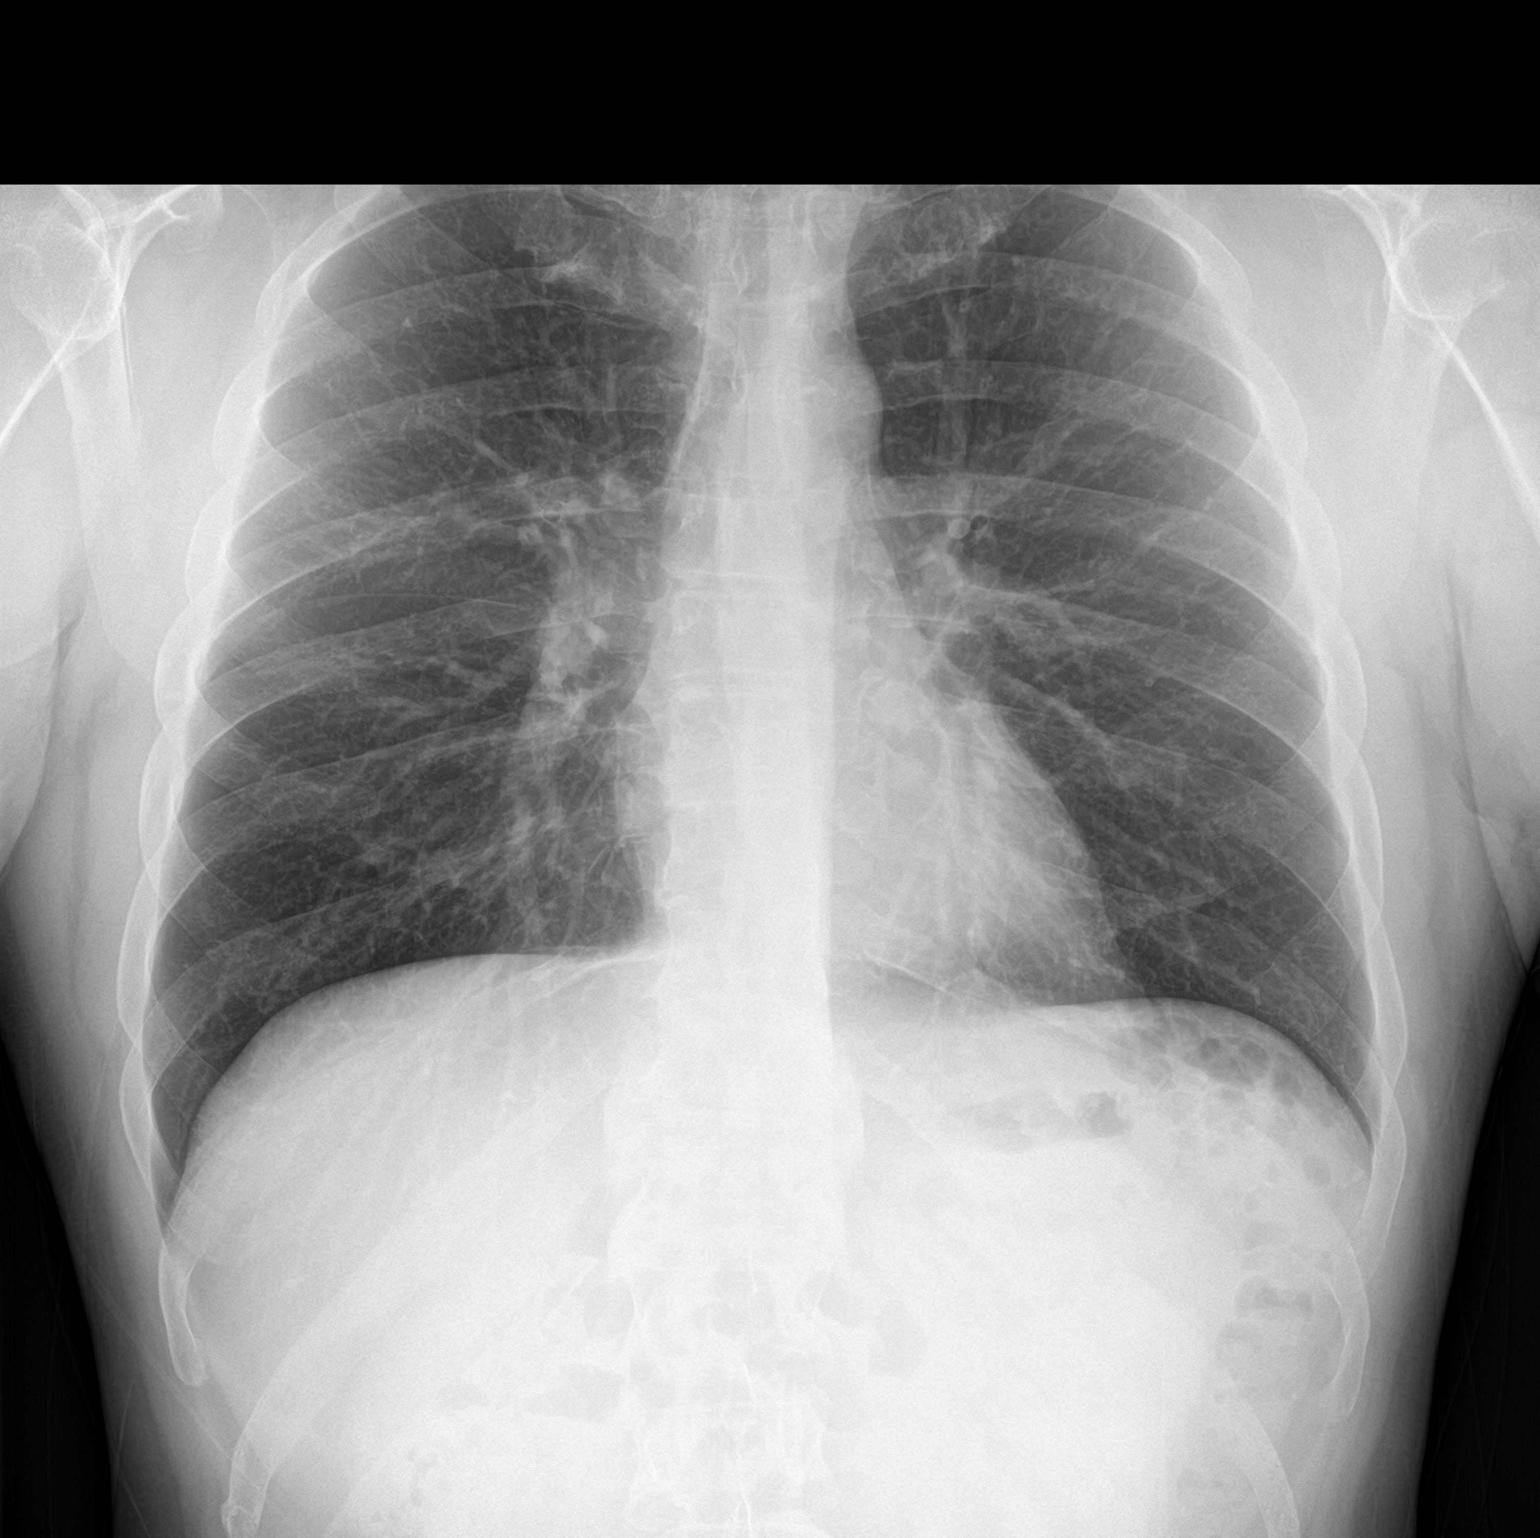

[chest lat]
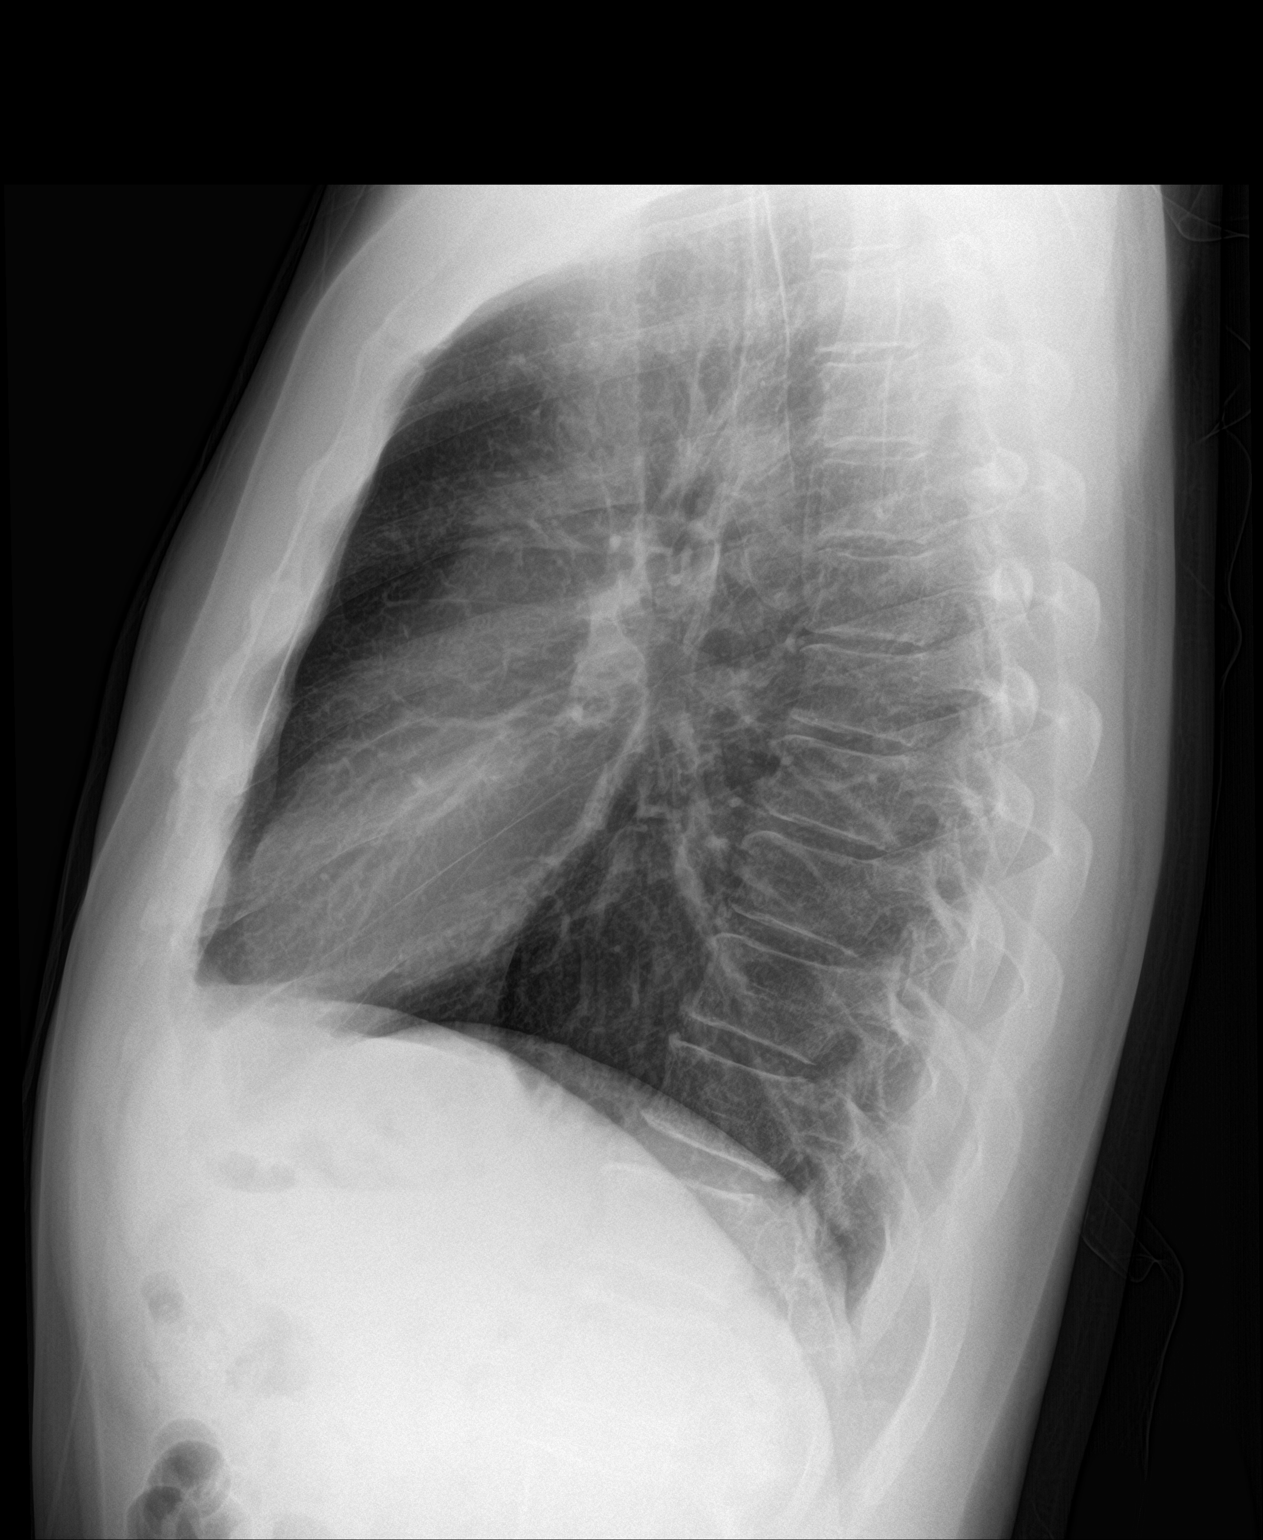

[2 of 2 positions shown; findings below may reference images not displayed]

FINDINGS: The heart size and mediastinal contours are within normal limits.
Both lungs are clear. The visualized skeletal structures are
unremarkable.
IMPRESSION: No active cardiopulmonary disease.

## 2023-10-03 ENCOUNTER — Encounter: Admitting: Family Medicine
